# Patient Record
Sex: Female | Born: 1970 | Race: White | Hispanic: No | Smoking: Never smoker
Health system: Southern US, Community
[De-identification: ages and names within clinical notes are randomized; demographics above are authoritative.]

## PROBLEM LIST (undated history)

## (undated) DIAGNOSIS — Z8759 Personal history of other complications of pregnancy, childbirth and the puerperium: Secondary | ICD-10-CM

## (undated) DIAGNOSIS — F411 Generalized anxiety disorder: Secondary | ICD-10-CM

## (undated) DIAGNOSIS — L904 Acrodermatitis chronica atrophicans: Secondary | ICD-10-CM

## (undated) DIAGNOSIS — J309 Allergic rhinitis, unspecified: Secondary | ICD-10-CM

## (undated) DIAGNOSIS — R768 Other specified abnormal immunological findings in serum: Secondary | ICD-10-CM

## (undated) DIAGNOSIS — K589 Irritable bowel syndrome without diarrhea: Secondary | ICD-10-CM

## (undated) DIAGNOSIS — Z8742 Personal history of other diseases of the female genital tract: Secondary | ICD-10-CM

## (undated) DIAGNOSIS — I7381 Erythromelalgia: Secondary | ICD-10-CM

## (undated) DIAGNOSIS — R51 Headache: Secondary | ICD-10-CM

## (undated) DIAGNOSIS — F419 Anxiety disorder, unspecified: Secondary | ICD-10-CM

## (undated) DIAGNOSIS — M5136 Other intervertebral disc degeneration, lumbar region: Secondary | ICD-10-CM

## (undated) DIAGNOSIS — N3010 Interstitial cystitis (chronic) without hematuria: Secondary | ICD-10-CM

## (undated) DIAGNOSIS — I7300 Raynaud's syndrome without gangrene: Secondary | ICD-10-CM

## (undated) HISTORY — DX: Personal history of other diseases of the female genital tract: Z87.42

## (undated) HISTORY — DX: Other specified abnormal immunological findings in serum: R76.8

## (undated) HISTORY — DX: Allergic rhinitis, unspecified: J30.9

## (undated) HISTORY — PX: DILATION AND CURETTAGE OF UTERUS: SHX78

## (undated) HISTORY — DX: Headache: R51

## (undated) HISTORY — PX: LAPAROSCOPY: SHX197

## (undated) HISTORY — PX: CHOLECYSTECTOMY: SHX55

## (undated) HISTORY — DX: Other intervertebral disc degeneration, lumbar region: M51.36

## (undated) HISTORY — DX: Raynaud's syndrome without gangrene: I73.00

## (undated) HISTORY — DX: Anxiety disorder, unspecified: F41.9

## (undated) HISTORY — DX: Interstitial cystitis (chronic) without hematuria: N30.10

## (undated) HISTORY — PX: ABDOMINAL HYSTERECTOMY: SHX81

## (undated) HISTORY — PX: TONSILLECTOMY AND ADENOIDECTOMY: SUR1326

## (undated) HISTORY — PX: APPENDECTOMY: SHX54

## (undated) HISTORY — DX: Erythromelalgia: I73.81

## (undated) HISTORY — DX: Generalized anxiety disorder: F41.1

## (undated) HISTORY — DX: Personal history of other complications of pregnancy, childbirth and the puerperium: Z87.59

## (undated) HISTORY — DX: Acrodermatitis chronica atrophicans: L90.4

## (undated) HISTORY — DX: Irritable bowel syndrome without diarrhea: K58.9

---

## 2002-01-09 ENCOUNTER — Inpatient Hospital Stay (HOSPITAL_COMMUNITY): Admission: AD | Admit: 2002-01-09 | Discharge: 2002-01-09 | Payer: Self-pay | Admitting: Obstetrics and Gynecology

## 2002-01-10 ENCOUNTER — Encounter (INDEPENDENT_AMBULATORY_CARE_PROVIDER_SITE_OTHER): Payer: Self-pay | Admitting: *Deleted

## 2002-01-10 ENCOUNTER — Ambulatory Visit (HOSPITAL_COMMUNITY): Admission: AD | Admit: 2002-01-10 | Discharge: 2002-01-10 | Payer: Self-pay | Admitting: *Deleted

## 2002-05-22 ENCOUNTER — Ambulatory Visit (HOSPITAL_COMMUNITY): Admission: RE | Admit: 2002-05-22 | Discharge: 2002-05-22 | Payer: Self-pay | Admitting: Obstetrics and Gynecology

## 2002-07-23 ENCOUNTER — Ambulatory Visit (HOSPITAL_COMMUNITY): Admission: RE | Admit: 2002-07-23 | Discharge: 2002-07-23 | Payer: Self-pay | Admitting: Obstetrics and Gynecology

## 2002-07-23 ENCOUNTER — Encounter: Payer: Self-pay | Admitting: Obstetrics and Gynecology

## 2002-09-24 ENCOUNTER — Other Ambulatory Visit: Admission: RE | Admit: 2002-09-24 | Discharge: 2002-09-24 | Payer: Self-pay | Admitting: Obstetrics and Gynecology

## 2002-10-22 ENCOUNTER — Other Ambulatory Visit: Admission: RE | Admit: 2002-10-22 | Discharge: 2002-10-22 | Payer: Self-pay | Admitting: Obstetrics and Gynecology

## 2003-01-31 ENCOUNTER — Ambulatory Visit (HOSPITAL_COMMUNITY): Admission: RE | Admit: 2003-01-31 | Discharge: 2003-01-31 | Payer: Self-pay | Admitting: Obstetrics and Gynecology

## 2003-04-12 ENCOUNTER — Inpatient Hospital Stay (HOSPITAL_COMMUNITY): Admission: AD | Admit: 2003-04-12 | Discharge: 2003-04-12 | Payer: Self-pay | Admitting: Obstetrics and Gynecology

## 2003-04-20 ENCOUNTER — Inpatient Hospital Stay (HOSPITAL_COMMUNITY): Admission: AD | Admit: 2003-04-20 | Discharge: 2003-04-25 | Payer: Self-pay | Admitting: Obstetrics and Gynecology

## 2003-04-26 ENCOUNTER — Encounter: Admission: RE | Admit: 2003-04-26 | Discharge: 2003-05-26 | Payer: Self-pay | Admitting: Obstetrics and Gynecology

## 2003-05-30 ENCOUNTER — Other Ambulatory Visit: Admission: RE | Admit: 2003-05-30 | Discharge: 2003-05-30 | Payer: Self-pay | Admitting: Obstetrics and Gynecology

## 2003-10-10 ENCOUNTER — Encounter (INDEPENDENT_AMBULATORY_CARE_PROVIDER_SITE_OTHER): Payer: Self-pay | Admitting: Specialist

## 2003-10-10 ENCOUNTER — Ambulatory Visit (HOSPITAL_COMMUNITY): Admission: RE | Admit: 2003-10-10 | Discharge: 2003-10-10 | Payer: Self-pay | Admitting: Urology

## 2003-10-10 ENCOUNTER — Ambulatory Visit (HOSPITAL_BASED_OUTPATIENT_CLINIC_OR_DEPARTMENT_OTHER): Admission: RE | Admit: 2003-10-10 | Discharge: 2003-10-10 | Payer: Self-pay | Admitting: Urology

## 2004-06-04 ENCOUNTER — Other Ambulatory Visit: Admission: RE | Admit: 2004-06-04 | Discharge: 2004-06-04 | Payer: Self-pay | Admitting: Obstetrics and Gynecology

## 2005-06-22 ENCOUNTER — Other Ambulatory Visit: Admission: RE | Admit: 2005-06-22 | Discharge: 2005-06-22 | Payer: Self-pay | Admitting: Obstetrics and Gynecology

## 2005-10-18 ENCOUNTER — Ambulatory Visit (HOSPITAL_BASED_OUTPATIENT_CLINIC_OR_DEPARTMENT_OTHER): Admission: RE | Admit: 2005-10-18 | Discharge: 2005-10-18 | Payer: Self-pay | Admitting: Urology

## 2006-07-03 ENCOUNTER — Ambulatory Visit (HOSPITAL_COMMUNITY): Admission: RE | Admit: 2006-07-03 | Discharge: 2006-07-03 | Payer: Self-pay | Admitting: Obstetrics and Gynecology

## 2006-08-23 ENCOUNTER — Ambulatory Visit: Payer: Self-pay | Admitting: Internal Medicine

## 2006-10-06 ENCOUNTER — Ambulatory Visit: Payer: Self-pay | Admitting: Internal Medicine

## 2006-10-10 DIAGNOSIS — F411 Generalized anxiety disorder: Secondary | ICD-10-CM | POA: Insufficient documentation

## 2006-10-10 DIAGNOSIS — N3010 Interstitial cystitis (chronic) without hematuria: Secondary | ICD-10-CM

## 2006-10-10 DIAGNOSIS — K589 Irritable bowel syndrome without diarrhea: Secondary | ICD-10-CM | POA: Insufficient documentation

## 2006-10-10 HISTORY — DX: Interstitial cystitis (chronic) without hematuria: N30.10

## 2006-10-10 HISTORY — DX: Generalized anxiety disorder: F41.1

## 2006-10-10 HISTORY — DX: Irritable bowel syndrome, unspecified: K58.9

## 2006-11-08 ENCOUNTER — Telehealth: Payer: Self-pay | Admitting: Internal Medicine

## 2006-11-20 ENCOUNTER — Ambulatory Visit: Payer: Self-pay | Admitting: Internal Medicine

## 2007-03-19 ENCOUNTER — Ambulatory Visit: Payer: Self-pay | Admitting: Internal Medicine

## 2007-03-19 LAB — CONVERTED CEMR LAB
ALT: 15 units/L (ref 0–35)
AST: 20 units/L (ref 0–37)
Albumin: 4.3 g/dL (ref 3.5–5.2)
Alkaline Phosphatase: 63 units/L (ref 39–117)
Amylase: 70 units/L (ref 27–131)
BUN: 10 mg/dL (ref 6–23)
Basophils Absolute: 0 10*3/uL (ref 0.0–0.1)
Basophils Relative: 0.2 % (ref 0.0–1.0)
Bilirubin, Direct: 0.1 mg/dL (ref 0.0–0.3)
CO2: 26 meq/L (ref 19–32)
Calcium: 9.2 mg/dL (ref 8.4–10.5)
Chloride: 103 meq/L (ref 96–112)
Creatinine, Ser: 0.8 mg/dL (ref 0.4–1.2)
Eosinophils Absolute: 0.1 10*3/uL (ref 0.0–0.6)
Eosinophils Relative: 1.7 % (ref 0.0–5.0)
GFR calc Af Amer: 105 mL/min
GFR calc non Af Amer: 87 mL/min
Glucose, Bld: 87 mg/dL (ref 70–99)
HCT: 39.7 % (ref 36.0–46.0)
Hemoglobin: 13.8 g/dL (ref 12.0–15.0)
Lymphocytes Relative: 27.1 % (ref 12.0–46.0)
MCHC: 34.8 g/dL (ref 30.0–36.0)
MCV: 85.9 fL (ref 78.0–100.0)
Magnesium: 2.1 mg/dL (ref 1.5–2.5)
Monocytes Absolute: 0.5 10*3/uL (ref 0.2–0.7)
Monocytes Relative: 10.9 % (ref 3.0–11.0)
Neutro Abs: 2.5 10*3/uL (ref 1.4–7.7)
Neutrophils Relative %: 60.1 % (ref 43.0–77.0)
Platelets: 264 10*3/uL (ref 150–400)
Potassium: 3.9 meq/L (ref 3.5–5.1)
RBC: 4.62 M/uL (ref 3.87–5.11)
RDW: 12.1 % (ref 11.5–14.6)
Sodium: 138 meq/L (ref 135–145)
TSH: 2.05 microintl units/mL (ref 0.35–5.50)
Total Bilirubin: 0.6 mg/dL (ref 0.3–1.2)
Total Protein: 7.1 g/dL (ref 6.0–8.3)
WBC: 4.2 10*3/uL — ABNORMAL LOW (ref 4.5–10.5)

## 2007-04-23 ENCOUNTER — Telehealth (INDEPENDENT_AMBULATORY_CARE_PROVIDER_SITE_OTHER): Payer: Self-pay | Admitting: *Deleted

## 2007-05-30 ENCOUNTER — Ambulatory Visit: Payer: Self-pay | Admitting: Internal Medicine

## 2007-05-31 ENCOUNTER — Telehealth: Payer: Self-pay | Admitting: Internal Medicine

## 2007-06-01 ENCOUNTER — Telehealth (INDEPENDENT_AMBULATORY_CARE_PROVIDER_SITE_OTHER): Payer: Self-pay | Admitting: *Deleted

## 2007-07-13 ENCOUNTER — Ambulatory Visit: Payer: Self-pay | Admitting: Internal Medicine

## 2007-09-14 ENCOUNTER — Ambulatory Visit: Payer: Self-pay | Admitting: Internal Medicine

## 2007-10-30 ENCOUNTER — Ambulatory Visit: Payer: Self-pay | Admitting: Internal Medicine

## 2007-12-25 ENCOUNTER — Telehealth: Payer: Self-pay | Admitting: Internal Medicine

## 2008-01-02 ENCOUNTER — Telehealth: Payer: Self-pay | Admitting: Internal Medicine

## 2008-02-14 ENCOUNTER — Telehealth: Payer: Self-pay | Admitting: Family Medicine

## 2008-04-09 ENCOUNTER — Ambulatory Visit: Payer: Self-pay | Admitting: Internal Medicine

## 2008-04-09 DIAGNOSIS — J309 Allergic rhinitis, unspecified: Secondary | ICD-10-CM | POA: Insufficient documentation

## 2008-04-09 HISTORY — DX: Allergic rhinitis, unspecified: J30.9

## 2008-04-25 ENCOUNTER — Ambulatory Visit (HOSPITAL_COMMUNITY): Admission: RE | Admit: 2008-04-25 | Discharge: 2008-04-25 | Payer: Self-pay | Admitting: Obstetrics and Gynecology

## 2008-05-05 ENCOUNTER — Encounter: Admission: RE | Admit: 2008-05-05 | Discharge: 2008-05-05 | Payer: Self-pay | Admitting: General Surgery

## 2008-05-19 ENCOUNTER — Telehealth: Payer: Self-pay | Admitting: Internal Medicine

## 2008-11-21 ENCOUNTER — Ambulatory Visit: Payer: Self-pay | Admitting: Family Medicine

## 2008-11-24 LAB — CONVERTED CEMR LAB
ALT: 13 units/L (ref 0–35)
AST: 19 units/L (ref 0–37)
BUN: 11 mg/dL (ref 6–23)
Basophils Absolute: 0 10*3/uL (ref 0.0–0.1)
Bilirubin, Direct: 0 mg/dL (ref 0.0–0.3)
Calcium: 9.5 mg/dL (ref 8.4–10.5)
Creatinine, Ser: 0.8 mg/dL (ref 0.4–1.2)
Eosinophils Relative: 1.3 % (ref 0.0–5.0)
GFR calc non Af Amer: 85.79 mL/min (ref >60)
HCT: 40.4 % (ref 36.0–46.0)
Lymphs Abs: 1.9 10*3/uL (ref 0.7–4.0)
Monocytes Relative: 6.7 % (ref 3.0–12.0)
Neutrophils Relative %: 65.6 % (ref 43.0–77.0)
Platelets: 319 10*3/uL (ref 150.0–400.0)
RDW: 12 % (ref 11.5–14.6)
TSH: 1.83 microintl units/mL (ref 0.35–5.50)
Total Bilirubin: 1 mg/dL (ref 0.3–1.2)
WBC: 7.4 10*3/uL (ref 4.5–10.5)

## 2009-03-18 ENCOUNTER — Telehealth: Payer: Self-pay | Admitting: Internal Medicine

## 2009-04-22 ENCOUNTER — Ambulatory Visit: Payer: Self-pay | Admitting: Internal Medicine

## 2009-04-22 DIAGNOSIS — I7381 Erythromelalgia: Secondary | ICD-10-CM

## 2009-04-22 HISTORY — DX: Erythromelalgia: I73.81

## 2009-04-23 LAB — CONVERTED CEMR LAB
Albumin: 4.3 g/dL (ref 3.5–5.2)
Basophils Relative: 0.2 % (ref 0.0–3.0)
CO2: 28 meq/L (ref 19–32)
Calcium: 9.5 mg/dL (ref 8.4–10.5)
Creatinine, Ser: 0.9 mg/dL (ref 0.4–1.2)
Eosinophils Absolute: 0.1 10*3/uL (ref 0.0–0.7)
Eosinophils Relative: 1.8 % (ref 0.0–5.0)
GFR calc non Af Amer: 74.72 mL/min (ref >60)
Glucose, Bld: 78 mg/dL (ref 70–99)
HCT: 38 % (ref 36.0–46.0)
Lymphs Abs: 1.3 10*3/uL (ref 0.7–4.0)
MCHC: 33.9 g/dL (ref 30.0–36.0)
MCV: 88.4 fL (ref 78.0–100.0)
Monocytes Absolute: 0.5 10*3/uL (ref 0.1–1.0)
Platelets: 278 10*3/uL (ref 150.0–400.0)
RBC: 4.3 M/uL (ref 3.87–5.11)
TSH: 1.47 microintl units/mL (ref 0.35–5.50)
Total Protein: 7.1 g/dL (ref 6.0–8.3)
WBC: 5.2 10*3/uL (ref 4.5–10.5)

## 2009-04-24 ENCOUNTER — Telehealth: Payer: Self-pay | Admitting: Internal Medicine

## 2009-04-28 ENCOUNTER — Telehealth: Payer: Self-pay | Admitting: Internal Medicine

## 2009-04-28 LAB — CONVERTED CEMR LAB: Anti Nuclear Antibody(ANA): NEGATIVE

## 2009-06-01 ENCOUNTER — Ambulatory Visit: Payer: Self-pay | Admitting: Internal Medicine

## 2010-01-12 ENCOUNTER — Encounter (INDEPENDENT_AMBULATORY_CARE_PROVIDER_SITE_OTHER): Payer: Self-pay | Admitting: Obstetrics and Gynecology

## 2010-01-12 ENCOUNTER — Ambulatory Visit (HOSPITAL_COMMUNITY): Admission: RE | Admit: 2010-01-12 | Discharge: 2010-01-13 | Payer: Self-pay | Admitting: Obstetrics and Gynecology

## 2010-04-27 NOTE — Assessment & Plan Note (Signed)
Summary: 4 week FU/et/pt rescd//ccm/PT RSC/CJR//PT RESCD//CCM   Vital Signs:  Patient profile:   40 year old female Weight:      138 pounds Temp:     98.5 degrees F Pulse rate:   72 / minute Pulse rhythm:   regular Resp:     12 per minute BP sitting:   108 / 72  (left arm) Cuff size:   regular  Vitals Entered By: Gladis Riffle, RN (June 01, 2009 8:54 AM) CC: 4 week ro; c/o tingling left foot and leg since increased aspirin--requests substitute for nasonex Is Patient Diabetic? No   CC:  4 week ro; c/o tingling left foot and leg since increased aspirin--requests substitute for nasonex.  History of Present Illness: erythromelalgia---much improved on ASA two times a day still with some discoloration and tingling no pain able to exercise but has some worsening of sxs after exercise  Preventive Screening-Counseling & Management  Alcohol-Tobacco     Smoking Status: never  Current Medications (verified): 1)  Zyrtec Allergy 10 Mg  Tabs (Cetirizine Hcl) .... Once Daily As Needed 2)  Xanax 0.25 Mg  Tabs (Alprazolam) .... 1/2-1 As Needed 3)  Multivitamins   Caps (Multiple Vitamin) .... One By Mouth Daily 4)  Advil 200 Mg  Caps (Ibuprofen) .... Prn 5)  Nasonex 50 Mcg/act Susp (Mometasone Furoate) .... 2 Spray Each Nares  Q D As Needed 6)  Pyridium 200 Mg Tabs (Phenazopyridine Hcl) .... One By Mouth Three Times A Day As Needed 7)  Aspirin 325 Mg Tabs (Aspirin) .... Take 1 Tablet By Mouth Two Times A Day  Allergies: 1)  ! Penicillin  Physical Exam  General:  alert and well-developed.   Head:  normocephalic and atraumatic.   Eyes:  pupils equal and pupils round.   Neck:  No deformities, masses, or tenderness noted. Lungs:  Normal respiratory effort, chest expands symmetrically. Lungs are clear to auscultation, no crackles or wheezes. Abdomen:  normal bowel sounds. Soft nontender without mass Msk:  No deformity or scoliosis noted of thoracic or lumbar spine.   Pulses:  R radial  normal, R dorsalis pedis normal, L radial normal, and L dorsalis pedis normal.   Neurologic:  cranial nerves II-XII intact and gait normal.     Impression & Recommendations:  Problem # 1:  ERYTHROMELALGIA (BMW-413.24) unclear whether any further eval necessary discuss with dr. Titus Dubin.  interesting story along with interstitial cystitis, etc... may need further immunology evaluation  Complete Medication List: 1)  Zyrtec Allergy 10 Mg Tabs (Cetirizine hcl) .... Once daily as needed 2)  Xanax 0.25 Mg Tabs (Alprazolam) .... 1/2-1 as needed 3)  Multivitamins Caps (Multiple vitamin) .... One by mouth daily 4)  Advil 200 Mg Caps (Ibuprofen) .... Prn 5)  Fluticasone Propionate 50 Mcg/act Susp (Fluticasone propionate) .... 2 sprays each nostril once daily 6)  Pyridium 200 Mg Tabs (Phenazopyridine hcl) .... One by mouth three times a day as needed 7)  Aspirin 325 Mg Tabs (Aspirin) .... Take 1 tablet by mouth two times a day Prescriptions: FLUTICASONE PROPIONATE 50 MCG/ACT  SUSP (FLUTICASONE PROPIONATE) 2 sprays each nostril once daily  #1 vial x 3   Entered and Authorized by:   Birdie Sons MD   Signed by:   Birdie Sons MD on 06/01/2009   Method used:   Electronically to        [facility] [address] [phone]* (retail)       [address]  [place], [place]  [address]       Ph: [phone] or [phone]       Fax: [phone]   RxID:   [id number]

## 2010-04-27 NOTE — Assessment & Plan Note (Signed)
Summary: fup on meds-foot issues//ccm   Vital Signs:  Patient profile:   40 year old female Weight:      138 pounds Temp:     98.3 degrees F Pulse rate:   72 / minute Resp:     12 per minute BP sitting:   100 / 76  (left arm)  Vitals Entered By: Gladis Riffle, RN (April 22, 2009 9:19 AM)   History of Present Illness: burning feet at night---have to run water on them at night. occurs nightly ongoing for months progressively worse  hands will turn bright red no other associated sxs she has no significant daytime sxs no sxs of raynauds  All other systems reviewed and were negative   Preventive Screening-Counseling & Management  Alcohol-Tobacco     Smoking Status: never  Current Problems (verified): 1)  Allergic Rhinitis Cause Unspecified  (ICD-477.9) 2)  Interstitial Cystitis  (ICD-595.1) 3)  Anxiety Disorder, Generalized  (ICD-300.02) 4)  Irritable Bowel Syndrome  (ICD-564.1)  Current Medications (verified): 1)  Zyrtec Allergy 10 Mg  Tabs (Cetirizine Hcl) .... Once Daily As Needed 2)  Xanax 0.25 Mg  Tabs (Alprazolam) .... 1/2-1 As Needed 3)  Multivitamins   Caps (Multiple Vitamin) .... One By Mouth Daily 4)  Advil 200 Mg  Caps (Ibuprofen) .... Prn 5)  Nasonex 50 Mcg/act Susp (Mometasone Furoate) .... 2 Spray Each Nares  Q D As Needed 6)  Pyridium 200 Mg Tabs (Phenazopyridine Hcl) .... One By Mouth Three Times A Day As Needed  Allergies: 1)  ! Penicillin  Comments:  Nurse/Medical Assistant: FU meds and c/o redness and swelling several toes both feet--began after ran half-marathon but kept spreading about four weeks ago--using steroid cream with some relief  The patient's medications and allergies were reviewed with the patient and were updated in the Medication and Allergy Lists. Gladis Riffle, RN (April 22, 2009 9:24 AM)  Past History:  Past Medical History: Last updated: 04/09/2008 anxiety attacks interstitial cystitis IBS endometriosis positive ANA    Past Surgical History: Last updated: 04/09/2008 surgery for endometriosis 04/08 Appendectomy  1987 Cholecystectomy  1998 Tonsillectomy  1977 miscarriages X2    Family History: Last updated: 04/09/2008 brother has lupus   Social History: Last updated: 04/09/2008 Married Never Smoked Regular exercise-yes Ets no  HH of 3   1 pet cats.      Risk Factors: Exercise: yes (03/19/2007)  Risk Factors: Smoking Status: never (04/22/2009)  Review of Systems       All other systems reviewed and were negative   Physical Exam  General:  Well-developed,well-nourished,in no acute distress; alert,appropriate and cooperative throughout examination Head:  normocephalic and atraumatic.   Eyes:  pupils equal and pupils round.   Ears:  R ear normal and L ear normal.   Neck:  No deformities, masses, or tenderness noted. Lungs:  Normal respiratory effort, chest expands symmetrically. Lungs are clear to auscultation, no crackles or wheezes. Heart:  Normal rate and regular rhythm. S1 and S2 normal without gallop, murmur, click, rub or other extra sounds. Abdomen:  normal bowel sounds. Soft nontender without mass Pulses:  R radial normal, R femoral normal, R popliteal normal, L radial normal, L femoral normal, and L popliteal normal.   Neurologic:  cranial nerves II-XII intact and gait normal.     Impression & Recommendations:  Problem # 1:  ERYTHROMELALGIA (ICD-443.82)  by hx asa two times a day labs today suspect idiopathic she is concerned with CTD---as she has family members with  lupus  Orders: Venipuncture (10272) T-Antinuclear Antib (ANA) (53664-40347) TLB-CBC Platelet - w/Differential (85025-CBCD) TLB-Sedimentation Rate (ESR) (85652-ESR) TLB-BMP (Basic Metabolic Panel-BMET) (80048-METABOL) TLB-Hepatic/Liver Function Pnl (80076-HEPATIC) TLB-TSH (Thyroid Stimulating Hormone) (84443-TSH)  Complete Medication List: 1)  Zyrtec Allergy 10 Mg Tabs (Cetirizine hcl) .... Once  daily as needed 2)  Xanax 0.25 Mg Tabs (Alprazolam) .... 1/2-1 as needed 3)  Multivitamins Caps (Multiple vitamin) .... One by mouth daily 4)  Advil 200 Mg Caps (Ibuprofen) .... Prn 5)  Nasonex 50 Mcg/act Susp (Mometasone furoate) .... 2 spray each nares  q d as needed 6)  Pyridium 200 Mg Tabs (Phenazopyridine hcl) .... One by mouth three times a day as needed 7)  Aspirin 325 Mg Tabs (Aspirin) .... Take 1 tablet by mouth two times a day Prescriptions: XANAX 0.25 MG  TABS (ALPRAZOLAM) 1/2-1 as needed  #30 x 0   Entered and Authorized by:   Birdie Sons MD   Signed by:   Birdie Sons MD on 04/22/2009   Method used:   Print then Give to Patient   RxID:   [id number]

## 2010-04-27 NOTE — Progress Notes (Signed)
Summary: reporting in and wants advice  Phone Note Call from Patient   Caller: Patient--live call Call For: Birdie Sons MD Summary of Call: States swelling of toes better though they remain discolored.  She was able to run a couple days.  She does want to know if she continues on ASA two times a day and if so, for how long.    ( Notified of ANA result.) Initial call taken by: Gladis Riffle, RN,  April 28, 2009 12:11 PM  Follow-up for Phone Call        stay on ASA---i'd like to see her in 4 weeks Follow-up by: Birdie Sons MD,  April 28, 2009 12:34 PM  Additional Follow-up for Phone Call Additional follow up Details #1::        Patient notified. appt made for 4 weeks. Additional Follow-up by: Gladis Riffle, RN,  April 28, 2009 1:26 PM

## 2010-04-27 NOTE — Progress Notes (Signed)
Summary: FYI  Phone Note Call from Patient Call back at [phone]   Caller: Patient Call For: Birdie Sons MD Summary of Call: wants to report she is having weird tingling of hands and feet since taking ASA.  She thinks her feet are looking somewhat better, but was still awakened in night to run cold water over hands and feet.  She wants to remind you she has positive ANA and is worried about Lupus.  She will call back on Tues as scheduled and no need to call back before then. Initial call taken by: Gladis Riffle, RN,  April 24, 2009 5:01 PM

## 2010-04-30 ENCOUNTER — Telehealth: Payer: Self-pay | Admitting: Internal Medicine

## 2010-04-30 NOTE — Telephone Encounter (Signed)
Ok to switch   With me.

## 2010-04-30 NOTE — Telephone Encounter (Signed)
ok 

## 2010-04-30 NOTE — Telephone Encounter (Signed)
Pt would like to switch from Dr Cato Mulligan to Dr Fabian Sharp.... Ok to switch?    Pt can be reached at [phone].

## 2010-05-06 ENCOUNTER — Ambulatory Visit (INDEPENDENT_AMBULATORY_CARE_PROVIDER_SITE_OTHER): Payer: Managed Care, Other (non HMO) | Admitting: Internal Medicine

## 2010-05-06 ENCOUNTER — Encounter: Payer: Self-pay | Admitting: Internal Medicine

## 2010-05-06 VITALS — BP 114/88 | HR 76 | Temp 98.0°F | Ht 63.0 in | Wt 141.0 lb

## 2010-05-06 DIAGNOSIS — J329 Chronic sinusitis, unspecified: Secondary | ICD-10-CM

## 2010-05-06 MED ORDER — DOXYCYCLINE HYCLATE 100 MG PO TABS: 100.0000 mg | ORAL_TABLET | Freq: Two times a day (BID) | ORAL | Status: AC

## 2010-05-06 NOTE — Progress Notes (Signed)
  Subjective:    Patient ID: Erica Saunders, female    DOB: 1971/01/16, 40 y.o.   MRN: [id number]  Sinusitis This is a new problem. The current episode started 1 to 4 weeks ago. The problem has been gradually worsening since onset. There has been no fever. Her pain is at a severity of 6/10. Associated symptoms include congestion, sinus pressure and swollen glands. Pertinent negatives include no chills, coughing, diaphoresis, ear pain, neck pain or shortness of breath. Past treatments include nothing.      Review of Systems  Constitutional: Negative for chills, diaphoresis, activity change and appetite change.  HENT: Positive for congestion and sinus pressure. Negative for ear pain, nosebleeds, neck pain and neck stiffness.   Eyes: Negative for pain and discharge.  Respiratory: Negative for cough, chest tightness and shortness of breath.   Cardiovascular: Negative for chest pain.  Gastrointestinal: Negative for abdominal pain.  Genitourinary: Negative for dysuria and flank pain.  Musculoskeletal: Negative for back pain and gait problem.  Neurological: Negative for dizziness.  Hematological: Negative for adenopathy.  Psychiatric/Behavioral: Negative for confusion.       Objective:   Physical Exam  Constitutional: She appears well-developed and well-nourished. No distress.  HENT:  Head: Normocephalic.  Right Ear: External ear normal.  Left Ear: External ear normal.  Eyes: EOM are normal. Right eye exhibits no discharge. Left eye exhibits no discharge.  Neck: No thyromegaly present.  Cardiovascular: Normal rate.   Pulmonary/Chest: No respiratory distress. She has no wheezes. She exhibits no tenderness.  Abdominal: Soft. Bowel sounds are normal. She exhibits no distension. There is no tenderness.  Musculoskeletal: She exhibits no edema.  Lymphadenopathy:    She has no cervical adenopathy.  Neurological: No cranial nerve deficit.  Psychiatric: She has a normal mood and affect. Her  behavior is normal.          Assessment & Plan:   presumed sinusitis. Given the duration will treat with an antibiotic. Side effects of antibiotics discussed. She will call if symptms persist.

## 2010-06-04 ENCOUNTER — Other Ambulatory Visit: Payer: Self-pay | Admitting: Obstetrics and Gynecology

## 2010-06-04 DIAGNOSIS — N6459 Other signs and symptoms in breast: Secondary | ICD-10-CM

## 2010-06-09 LAB — CBC
HCT: 30.4 % — ABNORMAL LOW (ref 36.0–46.0)
HCT: 41.6 % (ref 36.0–46.0)
Hemoglobin: 10.5 g/dL — ABNORMAL LOW (ref 12.0–15.0)
MCH: 30.7 pg (ref 26.0–34.0)
MCH: 29.7 pg (ref 26.0–34.0)
MCHC: 34.5 g/dL (ref 30.0–36.0)
MCHC: 33.3 g/dL (ref 30.0–36.0)
MCV: 89 fL (ref 78.0–100.0)
RDW: 13.1 % (ref 11.5–15.5)

## 2010-06-09 LAB — MRSA CULTURE

## 2010-06-09 LAB — BASIC METABOLIC PANEL
BUN: 11 mg/dL (ref 6–23)
CO2: 29 mEq/L (ref 19–32)
Chloride: 102 mEq/L (ref 96–112)
GFR calc non Af Amer: >60 mL/min (ref >60)
Glucose, Bld: 63 mg/dL — ABNORMAL LOW (ref 70–99)
Potassium: 3.9 mEq/L (ref 3.5–5.1)

## 2010-06-10 ENCOUNTER — Other Ambulatory Visit: Payer: Managed Care, Other (non HMO)

## 2010-06-11 ENCOUNTER — Ambulatory Visit
Admission: RE | Admit: 2010-06-11 | Discharge: 2010-06-11 | Disposition: A | Discharge status: Home / Self Care | Payer: Managed Care, Other (non HMO) | Source: Ambulatory Visit | Attending: Obstetrics and Gynecology | Admitting: Obstetrics and Gynecology

## 2010-06-11 DIAGNOSIS — N6459 Other signs and symptoms in breast: Secondary | ICD-10-CM

## 2010-08-13 NOTE — Op Note (Signed)
NAME:  Erica Saunders, GUIDO                       ACCOUNT NO.:  [id number]   MEDICAL RECORD NO.:  [id number]                   PATIENT TYPE:  AMB   LOCATION:  [facility]                                 FACILITY:  [facility]   PHYSICIAN:  Jamison Neighbor, M.D.               DATE OF BIRTH:  1970-10-19   DATE OF PROCEDURE:  10/10/2003  DATE OF DISCHARGE:                                 OPERATIVE REPORT   PREOPERATIVE DIAGNOSES:  Chronic pelvic pain/interstitial cystitis.   POSTOPERATIVE DIAGNOSES:  Chronic pelvic pain/interstitial cystitis.   PROCEDURE:  1. Cystoscopy.  2. Urethral calibration.  3. Hydrodistention of the bladder.  4. Marcaine and Pyridium instillation.  5. Marcaine and Kenalog injection.  6. Bladder biopsy.   SURGEON:  Jamison Neighbor, M.D.   ANESTHESIA:  General.   COMPLICATIONS:  None.   DRAINS:  None.   BRIEF HISTORY:  This 40 year old female has had longstanding problems with  urgency and frequency as well as dyspareunia.  The patient had resolution of  her symptoms during her pregnancy, and then they returned after the birth of  her child.  The patient's symptoms are much worse prior to her periods.  We  also note that she has irritable bowel syndrome and clearly all of the signs  point to the possibility that she has interstitial cystitis.  The patient  has had no evidence of urinary tract infection.  She has not responded to  anticholinergic therapy.  In fact, she feels those make her worse.  She  notes that anticholinergics tend to make her have a difficult time  urinating.  The patient was advised that she could have a potassium test in  the office as a method of testing for interstitial cystitis, but she refused  and asked that a cystoscopy and hydrodistention be performed.  She  understands that this may certainly make her feel worse temporarily, and  there is no guarantee she will have any benefit from a therapeutic  standpoint from this.  She gave full  informed consent for the procedure.   DESCRIPTION OF PROCEDURE:  After the successful induction of general  anesthesia, the patient was placed in the dorsal lithotomy position, prepped  with Betadine, and draped in the usual sterile fashion.  Careful bimanual  examination showed no abnormalities of the urethra.  The uterus was palpably  normal.  There was no prolapse noted.  She had no significant cystocele,  rectocele, or enterocele.  There were no masses on bimanual exam.  There was  no urethritis or vaginitis.  The urethra was calibrated to 32 [place] and was  normal in size with no evidence of stenosis or stricture.  The cystoscope  was inserted, and the bladder was carefully inspected.  It was free of any  tumor or stones.  Both ureteral orifices were normal in configuration and  location.  Hydrodistention of the bladder was then performed.  The bladder  was distended at a pressure of 100 cm of water for 5 minutes.  When the  bladder was drained, the patient was found to have a nearly normal bladder  capacity at 1000 mL but had significant glomerulations throughout the  bladder.  No ulcers could be seen.  The patient underwent a bladder biopsy.  This will be sent for mast cell analysis.  The biopsy site was cauterized  with a Bugbee electrode.  A mixture of Marcaine and Pyridium was left within  the bladder.  Marcaine and Kenalog were injected periurethrally.  The  patient tolerated the procedure well and was taken to the recovery room in  good condition.  She received intraoperative Toradol and Zofran as well as  B&O suppository.  She will be sent home with Lorcet Plus, Pyridium Plus, and  3 days of Tequin.  She will return to the office in 3 weeks time.  At that  point, we will determine if she has had any improvement from her symptoms.  If the patient has not had major improvement, she will be started on  instillation therapy.  If she does feel better, she will be started on oral   therapy with Elmiron, Atarax, and possibly a low dose tricyclic  antidepressant.                                               Jamison Neighbor, M.D.    RJE/MEDQ  D:  10/10/2003  T:  10/10/2003  Job:  [id number]   cc:   Marcelino Duster L. Vincente Poli, M.D.  [address]  [place] [address]  Fax: [phone]

## 2010-08-13 NOTE — H&P (Signed)
Erica Saunders, KAWA NO.:  [id number]   MEDICAL RECORD NO.:  [id number]          PATIENT TYPE:  AMB   LOCATION:  [facility]                           FACILITY:  [facility]   PHYSICIAN:  Lenoard Aden, M.D.DATE OF BIRTH:  09/25/1970   DATE OF ADMISSION:  07/02/2006  DATE OF DISCHARGE:                              HISTORY & PHYSICAL   CHIEF COMPLAINT:  Pelvic pain.  She is a 40 year old Caucasian female,  G3 P1 with a history of persistent pain, dysmenorrhea and desire to  conceive who presents for laparoscopic evaluation.   She has ALLERGY TO PENICILLIN.  She has a history of appendectomy in  1986, cholecystectomy in 1988 and a D and C in 2003.  History of  tonsillectomy, history of treatment for interstitial cystitis.   MEDICATIONS:  Advil p.r.n., Zyrtec.   She has a history of one uncomplicated vaginal delivery.   PHYSICAL EXAM:  GENERAL:  She is a well-developed, well-nourished white  female in no acute distress.  HEENT:  Normal.  LUNGS;  Clear.  HEART:  Regular rhythm.  ABDOMEN:  Soft, nontender.  PELVIC:  Normal size uterus and no adnexal masses.  EXTREMITIES:  Normal.  NEUROLOGIC:  Exam is nonfocal.   IMPRESSION:  Severe dysmenorrhea, refractory therapy.   PLAN:  Proceed with diagnostic  laparoscopy, possible ablation of  endometriosis.  Risks from anesthesia, infection, bleeding, and  intraabdominal ________ discussed, ________ complications to include  ________.  The patient acknowledges and wishes to proceed.      Lenoard Aden, M.D.  Electronically Signed     RJT/MEDQ  D:  07/02/2006  T:  07/03/2006  Job:  [id number]   cc:   Lenoard Aden, M.D.  Fax: [phone]

## 2010-08-13 NOTE — H&P (Signed)
   NAME:  Erica Saunders, Erica Saunders                       ACCOUNT NO.:  [id number]   MEDICAL RECORD NO.:  [id number]                   PATIENT TYPE:  AMB   LOCATION:  [facility]                                 FACILITY:  [facility]   PHYSICIAN:  Tracie Harrier, M.D.              DATE OF BIRTH:  18-Apr-1970   DATE OF ADMISSION:  01/10/2002  DATE OF DISCHARGE:                                HISTORY & PHYSICAL   HISTORY OF PRESENT ILLNESS:  The patient is a 40 year old female gravida 1  at nine weeks gestation by estimated last menstrual period.  She underwent  an ultrasound yesterday which showed intrauterine fetal demise at about six  weeks.  She declined D&C at that time.  The patient is admitted now with  heavy vaginal bleeding and extreme uterine cramping.  She is now admitted  for D&C.  Again, the option of expected management versus D&C given and she  strongly requested D&C.   MEDICAL HISTORY:  None.   SURGICAL HISTORY:  1. Tonsillectomy.  2. Appendectomy.  3. Cholecystectomy.   CURRENT MEDICATIONS:  Prenatal vitamins.   ALLERGIES:  PENICILLIN and PROZAC.   PHYSICAL EXAMINATION:  VITAL SIGNS:  Stable, afebrile.  GENERAL:  She is a well-developed, well-nourished female in a moderate  amount of pain.  HEENT:  Within normal limits.  NECK:  Supple without adenopathy or thyromegaly.  HEART:  Regular rate and rhythm without murmur, gallop, or rub.  LUNGS:  Clear.  BREAST:  Exam deferred.  ABDOMEN:  Soft and benign.  EXTREMITIES AND NEUROLOGIC:  Grossly normal.  PELVIC:  Deferred at time of admission.  This will be performed under  anesthesia when the patient is more comfortable.   LABORATORY DATA:  Blood type is Rh negative.  The patient did receive RhoGAM  on January 09, 2002.   ADMITTING DIAGNOSES:  1. Incomplete abortion.  2. Negative blood type (status post RhoGAM on January 09, 2002).   PLAN:  D&E.    DISCUSSION:  The risks and benefits of this procedure was discussed with the  patient.  The option of expectant management versus D&E given.  She strongly  requested D&E.  Questions were answered regarding the surgery.                                                 Tracie Harrier, M.D.    REG/MEDQ  D:  01/10/2002  T:  01/11/2002  Job:  [id number]

## 2010-08-13 NOTE — Discharge Summary (Signed)
NAME:  Erica Saunders, Erica Saunders                       ACCOUNT NO.:  [id number]   MEDICAL RECORD NO.:  [id number]                   PATIENT TYPE:  INP   LOCATION:  9124                                 FACILITY:  [facility]   PHYSICIAN:  Freddy Finner, M.D.                DATE OF BIRTH:  Dec 17, 1970   DATE OF ADMISSION:  04/20/2003  DATE OF DISCHARGE:  04/25/2003                                 DISCHARGE SUMMARY   ADMITTING DIAGNOSES:  1. Intrauterine pregnancy at 57 1/2 weeks' estimated gestational age.  2. Induction of labor due to post dates.   DISCHARGE DIAGNOSES:  1. Status post low transverse cesarean section secondary to failure to     progress.  2. Viable female infant.   PROCEDURE:  Primary low transverse cesarean section.   REASON FOR ADMISSION:  Please see written H&P.   HOSPITAL COURSE:  The patient was a 40 year old prima gravida that was  admitted to __________ [facility] for two-stage induction due to post  dates.  On admission, the cervix was closed, 50% effaced, with the vertex at  a -3 station.  Contractions were noted to be irregular.  Fetal heart tones  were reactive in the 140s.  Cytotec was administered that evening for  ripening of the cervix.  On the following morning, the patient was very  uncomfortable.  Vital signs were stable.  She was afebrile.  Fetal heart  tones continued to be reactive.  Contractions were noted to be every two  minutes.  The cervix was now dilated to 1 cm, 90% effaced, at a -2 station.  Artifical rupture of membranes was performed which revealed clear fluid.  Epidural was placed for patient's comfort.  Later that evening, the patient  had been in labor all day.  The epidural was in place.  The cervix was now  dilated to 4 cm, 90% effaced, at a -2 station.  Vertex was noted to be in  the transverse position.  The decision was made to proceed with a low  transverse cesarean section due to failure to progress.  The patient was  then transferred  to the operating room where epidural was dosed to an  adequate surgical level.  A low transverse incision was made with the  delivery of a viable female infant weighing 7 pounds 9 ounces with Apgars of 8  at one minute and 9 at five minutes.  The patient tolerated the procedure  well and was taken to the recovery room in stable condition.  On  postoperative day one, the patient was without complaints.  Vital signs were  stable.  She was afebrile.  Abdomen was soft with good return of bowel  function.  The fundus was firm and nontender.  Abdominal dressing was noted  to be clean, dry, and intact.  On postoperative day two, vital signs were  stable.  The patient was afebrile.  Abdomen was soft.  Fundus was firm.  Abdominal dressing had been removed revealing an incision that was clean,  dry, and intact.  On postoperative day three, the patient was somewhat  teary.  The baby had lost weight and seemed to be somewhat fussy.  Vital  signs were stable.  She was afebrile.  Abdomen was slightly distended.  Fundus was firm and nontender.  Some slight erythema was noted superior to  the incision site at the midpoint.  Otherwise, the incision was clean, dry,  and intact.  She was ambulating well and tolerating a regular diet without  complaints of nausea or vomiting.  On postoperative day four, the patient  was without complaints.  Vital signs were stable.  She was afebrile.  Fundus  was firm and nontender.  Abdomen was soft.  Incision was clean, dry, and  intact.  Staples were removed.  Some slight ecchymosis was noted inferior to  the incisional site.  Instructions are __________ and the patient was  discharged home.   CONDITION ON DISCHARGE:  Good.   DIET:  Regular as tolerated.   ACTIVITY:  No heavy lifting.  No driving times two weeks.  No vaginal entry.   FOLLOWUP:  The patient is to follow up in the office in one to two weeks for  an incision check.  She is to call for a temperature greater  than 100  degrees, persistent nausea and vomiting, heavy vaginal bleeding, and/or  redness or drainage from the incisional site.   DISCHARGE MEDICATIONS:  1. Percocet 5/325, #30, one p.o. q.4-6h. as needed for pain.  2. Motrin 600 mg q.6h. as needed.  3. Prenatal vitamins one p.o. daily.  4. Colace one p.o. daily as needed.     Julio Sicks, N.P.                        Freddy Finner, M.D.    CC/MEDQ  D:  05/26/2003  T:  05/26/2003  Job:  [id number]

## 2010-08-13 NOTE — Op Note (Signed)
   NAMENAKEA, GOUGER                       ACCOUNT NO.:  [id number]   MEDICAL RECORD NO.:  [id number]                   PATIENT TYPE:  AMB   LOCATION:  [facility]                                 FACILITY:  [facility]   PHYSICIAN:  Tracie Harrier, M.D.              DATE OF BIRTH:  [birth year]   DATE OF PROCEDURE:  01/10/2002  DATE OF DISCHARGE:                                 OPERATIVE REPORT   PREOPERATIVE DIAGNOSES:  Incomplete abortion.   POSTOPERATIVE DIAGNOSES:  Incomplete abortion.   PROCEDURE:  Dilatation and evacuation.   SURGEON:  Tracie Harrier, M.D.   ANESTHESIA:  MAC.   ESTIMATED BLOOD LOSS:  Less than 20 cc.   COMPLICATIONS:  None.   FINDINGS:  Products of conception.   PROCEDURE:  The patient was taken to the operating room where a MAC  anesthetic was administered.  The patient was placed on the operating table  in the dorsal lithotomy position.  The perineum and vagina were prepped and  draped in the usual sterile fashion with Betadine and sterile drapes.  A  speculum was placed inside the vagina and the anterior lip of the cervix was  grasped with a single tooth tenaculum.  The cervix was noted to be quite  dilated.  Next, using a number 7 suction curette the uterus was thoroughly  emptied in the standard fashion.  Approximately three passes were made  throughout the intrauterine cavity to empty it.  All vaginal instruments  were then removed.  The patient tolerated this fairly well, but did move a  great deal during the procedure.   The patient does have a negative blood type, but did receive RhoGAM on  January 09, 2002.                                               Tracie Harrier, M.D.    REG/MEDQ  D:  01/10/2002  T:  01/11/2002  Job:  [id number]

## 2010-08-13 NOTE — Op Note (Signed)
NAMESILVANA, HOLECEK NO.:  [id number]   MEDICAL RECORD NO.:  [id number]          PATIENT TYPE:  AMB   LOCATION:  [facility]                         FACILITY:  [facility]   PHYSICIAN:  Jamison Neighbor, M.D.  DATE OF BIRTH:  Jul 17, 1970   DATE OF PROCEDURE:  DATE OF DISCHARGE:                                 OPERATIVE REPORT   PREOPERATIVE DIAGNOSIS:  Interstitial cystitis, painful bladder syndrome.   POSTOPERATIVE DIAGNOSIS:  Interstitial cystitis, painful bladder syndrome.   PROCEDURE:  Cystoscopy, urethral calibration, hydrodistention of the  bladder, Marcaine and Pyridium instillation, Marcaine and Kenalog injection.   SURGEON:  Jamison Neighbor, M.D.   ANESTHESIA:  General.   COMPLICATIONS:  None.   DRAINS:  None.   BRIEF HISTORY:  This 40 year old female has had evidence of painful bladder  syndrome.  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  The patient is interested in repeat hydrodistention because she had  such good long-term relief from the first hydrodistention.  She is  interested in getting pregnant and is aware of the fact that many of the  interstitial cystitis medications cannot be taken during pregnancy,  particularly Elmiron.  For that reason, she hopes to have some improvement  until she is pregnant, and then she realizes there is an approximately 80%  chance that she will go into remission during pregnancy.  The patient  understands the risks and benefits of the procedure and especially notes  there is no guarantee that she will have any improvement in her symptoms.  She gave full informed consent.   PROCEDURE:  After the successful induction of general anesthesia, the  patient is placed in a dorsal lithotomy position, prepped with Betadine, and  draped in the usual sterile  fashion.  Careful bimanual examination revealed  an unremarkable urethra with no signs of diverticulum.  There was no  cystocele, rectocele, or enterocele.  There was no mass on bimanual exam.  The urethra was calibrated to a 54 [place] with female urethral sounds with  no evidence of stenosis or stricture.  The cystoscope was inserted.  The  bladder was carefully inspected.  It was free of any tumor or stones.  Both  ureteral orifices were normal in configuration and location.  The bladder  was distended at a pressure of 100 cm of water for five minutes.  When the  bladder was drained, glomerulations could be seen throughout the bladder,  but the bladder capacity was quite normal at over 1000 cc, approaching 1100  cc.  The number of glomerulations was relatively modest and had we not had  her history of pain and positive biopsy for mast cells, we would consider  this to be a reasonably normal hydrodistention.  There were no ulcers and  nothing to require biopsy  or cauterization.  The bladder was drained.  A mixture of Marcaine and  Pyridium was left in the bladder.  A mixture of Marcaine and  Kenalog was  injected periurethrally.  The patient tolerated the procedure well and was  taken to the recovery room in good condition.           ______________________________  Jamison Neighbor, M.D.  Electronically Signed     RJE/MEDQ  D:  10/18/2005  T:  10/18/2005  Job:  [id number]

## 2010-08-13 NOTE — Op Note (Signed)
NAME:  Erica Saunders, Erica Saunders                       ACCOUNT NO.:  [id number]   MEDICAL RECORD NO.:  [id number]                   PATIENT TYPE:  INP   LOCATION:  9124                                 FACILITY:  [facility]   PHYSICIAN:  Michelle L. Vincente Poli, M.D.            DATE OF BIRTH:  1971-01-30   DATE OF PROCEDURE:  04/21/2003  DATE OF DISCHARGE:                                 OPERATIVE REPORT   PREOPERATIVE DIAGNOSES:  1. Intrauterine pregnancy at  40 weeks 3 days.  2. Failure to progress.   POSTOPERATIVE DIAGNOSES:  1. Intrauterine pregnancy at  40 weeks 3 days.  2. Failure to progress.   PROCEDURES:  Primary low transverse cesarean section.   SURGEON:  Michelle L. Vincente Poli, M.D.   ANESTHESIA:  Epidural.   ESTIMATED BLOOD LOSS:  500 mL.   FINDINGS:  A female infant in cephalic presentation, Apgars 8 at one minute, 9  at five minutes, with a weight of 7 pounds 9 ounces.   DESCRIPTION OF PROCEDURE:  The patient was taken to the operating room.  She  was given her epidural in labor and delivery but it was dosed in the  operating room and found to be adequate.  A Foley catheter was already  placed in the bladder.  A low transverse incision was made in the abdomen  after a sterile drape was applied, carried down to the fascia.  The fascia  was scored in the midline, extended laterally.  The fascial incision was  then developed and the rectus muscles were separated from the peritoneum.  The peritoneum was then stretched.  The peritoneal incision was then  stretched further and the bladder blade was inserted and the lower uterine  segment was identified.  A bladder flap was created sharply and then  digitally and the bladder blade was then readjusted.  A low transverse  incision was made in the uterus.  The uterus was entered using a hemostat.  The baby was in cephalic presentation, was in transverse position, delivered  easily with two pulls of the vacuum and one pop-off.  The baby was a  female  infant, and Apgars were 8 at one minute and 9 at five minutes, weight 7  pounds 9 ounces.  The cord was clamped and cut.  The baby was handed to the  waiting pediatrician.  The cord blood was obtained.  The placenta was  manually removed and noted to be normal and intact and was removed.  The  uterus was exteriorized and cleared of all clots and debris.  The uterine  incision was closed in a single layer using 0 chromic in a continuous  running locked stitch.  It was returned to the abdomen and irrigation was  performed.  The incision was reinspected and noted to be hemostatic.  The  peritoneum was closed with an 0 Vicryl in continuous running stitch and the  rectus muscles were reapproximated using the  same 0 Vicryl.  The fascia was  closed using 0 Vicryl in continuous running stitch  starting at each corner and meeting in the midline.  After irrigation of the  subcutaneous layer, the skin was closed with staples.  All sponge, lap, and  instrument counts were correct x2.  The patient tolerated the procedure well  and went to the recovery room in stable condition.                                               Michelle L. Vincente Poli, M.D.    Florestine Avers  D:  04/21/2003  T:  04/22/2003  Job:  [id number]

## 2010-08-13 NOTE — Op Note (Signed)
NAMESHULAMIT, DONOFRIO NO.:  [id number]   MEDICAL RECORD NO.:  [id number]          PATIENT TYPE:  AMB   LOCATION:  [facility]                           FACILITY:  [facility]   PHYSICIAN:  Lenoard Aden, M.D.DATE OF BIRTH:  [birth year]   DATE OF PROCEDURE:  07/03/2006  DATE OF DISCHARGE:                               OPERATIVE REPORT   PREOPERATIVE DIAGNOSES:  1. Pelvic pain.  2. Dysmenorrhea.  3. Dyspareunia.   POSTOPERATIVE DIAGNOSES:  1. Pelvic pain.  2. Dysmenorrhea.  3. Dyspareunia.   PROCEDURE:  Diagnostic laparoscopy, lysis of left adnexal adhesions.   SURGEON:  Lenoard Aden, M.D.   ANESTHESIA:  General.   ESTIMATED BLOOD LOSS:  Less was 50 mL.   COMPLICATIONS:  None.   DRAINS:  Foley which is taken out postoperatively.   COUNTS:  Correct.   Patient to recovery room in good condition.   DESCRIPTION OF PROCEDURE:  After being apprised of risks of anesthesia,  infection, bleeding, injury to abdominal organs with need for repair,  delayed versus immediate complications to include bowel and bladder  injury, inability to cure pelvic pain, patient was brought to the  operating room where she was administered general anesthetic without  complications, prepped and draped in usual sterile fashion.  Foley  catheter placed.  Hulka tenaculum placed per vagina.  Infraumbilical  incision made with a scalpel.  Veress needle placed.  Opening pressure -  2 noted, 4 liters CO2 insufflated without difficulty.  Trocar placed.  Atraumatic trocar entry visualized.  Absent gallbladder, appendix not  visualized but no adhesions in the appendiceal bed noted.  At this time  attention was turned to the pelvis which reveals bilateral normal tubes,  normal ovaries, normal anterior and posterior cul-de-sac.  One 5 mm site  placed in the midline along the previous C-section incision.  Manipulation reveals some left adnexal adhesions of the bowel mesentery  which were lysed  bluntly, using a probe, freeing up the left adnexa  which then appears to be normal with a left tubal fimbriae and the right  tubal fimbria found to be delicate and normal, bilateral  normal ovaries as well.  At this time, all instruments are removed under  direct visualization.  CO2 is released.  Incision is closed using 0  Vicryl and Dermabond.  Instruments removed from the vagina.  The patient  tolerates procedure well and is transferred to recovery in good  condition.      Lenoard Aden, M.D.  Electronically Signed     RJT/MEDQ  D:  07/03/2006  T:  07/03/2006  Job:  [id number]

## 2011-06-29 ENCOUNTER — Encounter: Payer: Self-pay | Admitting: Internal Medicine

## 2011-06-29 ENCOUNTER — Ambulatory Visit (INDEPENDENT_AMBULATORY_CARE_PROVIDER_SITE_OTHER): Payer: Managed Care, Other (non HMO) | Admitting: Internal Medicine

## 2011-06-29 VITALS — BP 110/70 | HR 109 | Temp 98.8°F | Wt 142.0 lb

## 2011-06-29 DIAGNOSIS — J069 Acute upper respiratory infection, unspecified: Secondary | ICD-10-CM | POA: Insufficient documentation

## 2011-06-29 DIAGNOSIS — Z880 Allergy status to penicillin: Secondary | ICD-10-CM

## 2011-06-29 DIAGNOSIS — J329 Chronic sinusitis, unspecified: Secondary | ICD-10-CM | POA: Insufficient documentation

## 2011-06-29 MED ORDER — HYDROCODONE-HOMATROPINE 5-1.5 MG/5ML PO SYRP: 5.0000 mL | ORAL_SOLUTION | ORAL | Status: AC | PRN

## 2011-06-29 MED ORDER — AZITHROMYCIN 250 MG PO TABS: 250.0000 mg | ORAL_TABLET | ORAL | Status: AC

## 2011-06-29 NOTE — Patient Instructions (Signed)
Continue saline and decongestants  Cough med as needed. Antibiotic for sinusitis Expect improvement in the face pressure in the next 3-5 days .  Cough could continue for  Another week or 2 .

## 2011-06-29 NOTE — Progress Notes (Signed)
  Subjective:    Patient ID: Erica Saunders, female    DOB: 08-24-1970, 41 y.o.   MRN: [id number]  HPI Patient comes in today for SDA for  new problem evaluation. Probably cold about 7+ days ago. And now getting worse  And now had head pressure and face pain  And not getting better . Hard to sleep.  Cough is an issues.   Fever this am 100 last pm.  Tried old cough med  Tried left over hydrocodone.  Zyrtec deongestant.  Some help but continueing and worse.  coughin and blowing phelgm.  Family has been sick but is better .  Review of Systems Neg vision change fever now chills   No NVD  No new rashes   Hx of swelling and rash with  pcn hasnt atken cephalospsroin.  Can take axithro had vomiting with biaxin    Objective:   Physical Exam BP 110/70  Pulse 109  Temp 98.8 F (37.1 C)  Wt 142 lb (64.411 kg)  SpO2 98% WDWN in NAD  quiet respirations; moderately congested  somewhat hoarse. Non toxic . HEENT: Normocephalic ;atraumatic , Eyes;  PERRL, EOMs  Full, lids and conjunctiva clear,,Ears: no deformities, canals nl, TM landmarks normal, Nose: no deformity or discharge but congested;face minimally tender  Right side of maxilla an frontal Mouth : OP clear without lesion or edema . Neck: Supple without adenopathy or masses or bruits Chest:  Clear to A&P without wheezes rales or rhonchi CV:  S1-S2 no gallops or murmurs peripheral perfusion is normal Skin :nl perfusion and no acute rashes   Purplish distal toes ( old ) hands normal      Assessment & Plan:    Prolonged   Uri with worsening   sinsutis  Secondary 9( primary  viral infection.)  Can add antibiotic  Second line cause of pcn allergy To regimen  as directed .   Risk benefit of medication discussed.

## 2011-06-30 ENCOUNTER — Telehealth: Payer: Self-pay | Admitting: Internal Medicine

## 2011-06-30 MED ORDER — HYDROCOD POLST-CHLORPHEN POLST 10-8 MG/5ML PO LQCR: 5.0000 mL | Freq: Two times a day (BID) | ORAL | Status: DC | PRN

## 2011-06-30 NOTE — Telephone Encounter (Signed)
Left a message for pt to return call 

## 2011-06-30 NOTE — Telephone Encounter (Signed)
Dr. Fabian Sharp did approve Tussinex take 5 cc q 12 prn and dispense 60 cc. I called in script and spoke with pt.

## 2011-06-30 NOTE — Telephone Encounter (Signed)
Pt ha questions about cough meds called in yesterday. Pt would like to know why a different med was call in the she previously had.

## 2011-07-07 ENCOUNTER — Telehealth: Payer: Self-pay | Admitting: Internal Medicine

## 2011-07-07 NOTE — Telephone Encounter (Signed)
Unsure if switching antibiotic will help but if we are considering bacterial sinusitis  Let me see hr tomorrow.

## 2011-07-07 NOTE — Telephone Encounter (Signed)
Pt states woke up and she is having pain back in her jaw along with congestion.  Pt states she feels like she has been hit by a truck.  Pt states she is sensitive to antibotics and that is why only a five day supply was given.  Pt denies fever. Pls advise.

## 2011-07-07 NOTE — Telephone Encounter (Signed)
Pt was seen on 06-29-11 for sinusitis. Pt stated abx did not work. Pt is still having sinus pressure and headaches. [facility]

## 2011-07-08 ENCOUNTER — Ambulatory Visit (INDEPENDENT_AMBULATORY_CARE_PROVIDER_SITE_OTHER): Payer: Managed Care, Other (non HMO) | Admitting: Internal Medicine

## 2011-07-08 ENCOUNTER — Encounter: Payer: Self-pay | Admitting: Internal Medicine

## 2011-07-08 VITALS — BP 118/80 | HR 60 | Temp 97.9°F | Wt 143.0 lb

## 2011-07-08 DIAGNOSIS — J329 Chronic sinusitis, unspecified: Secondary | ICD-10-CM

## 2011-07-08 DIAGNOSIS — L819 Disorder of pigmentation, unspecified: Secondary | ICD-10-CM

## 2011-07-08 DIAGNOSIS — J309 Allergic rhinitis, unspecified: Secondary | ICD-10-CM

## 2011-07-08 MED ORDER — PREDNISONE 20 MG PO TABS: ORAL_TABLET | ORAL | Status: AC

## 2011-07-08 MED ORDER — LEVOFLOXACIN 750 MG PO TABS: 750.0000 mg | ORAL_TABLET | Freq: Every day | ORAL | Status: AC

## 2011-07-08 NOTE — Telephone Encounter (Signed)
Pt is sch for 3pm today

## 2011-07-08 NOTE — Progress Notes (Signed)
  Subjective:    Patient ID: Erica Saunders, female    DOB: 08-05-1970, 41 y.o.   MRN: [id number]  HPI She comes   into the office today because of continued problems with upper respiratory cough and sinus pressure. See phone note from yesterday. She is continued intermittent but severe face pressure with copious nasal mucous without associated fever. However sometimes the ankle her jaw hurts tightness. She is taking the antibiotic which is azithromycin with no bad side effects today is the first day she has felt better. She is 9 days into the medication.  He has history of allergy rhinitis but isn't really sure that this is related. No vomiting vision change hasn't drainage cough. No shortness of breath or wheezing  Review of Systems  no fever or weight loss does turn purplish red nodes like she uses a yeast on her toes at night because they feel warm and she uses ice to make a cold and feel better if they will swell up if she does not do that although her symptoms get better in the summer.  Past history family history social history reviewed in the electronic medical record.       Objective:   Physical Exam BP 118/80  Pulse 60  Temp(Src) 97.9 F (36.6 C) (Oral)  Wt 143 lb (64.864 kg)  SpO2 98% WDWN in NAD  quiet respirations;  congested  somewhat hoarse. Non toxic . HEENT: Normocephalic ;atraumatic , Eyes;  PERRL, EOMs  Full, lids and conjunctiva clear,,Ears: no deformities, canals nl, TM landmarks normal, Nose:  but congested;face  Tender maxillar right more than left ans some frontal  Mouth : OP clear without lesion or edema . Neck: Supple without adenopathy or masses or bruits Chest:  Clear to A&P without wheezes rales or rhonchi CV:  S1-S2 no gallops or murmurs peripheral perfusion is normal Skin :no acute rashes  Middle toes purplish distally  Hands are normally perfused      Assessment & Plan:  Persistent upper respiratory infection symptoms sinusitis.  Possible underlying  allergy slight improvement over the last 8 days.  Options discussed this is before weekend. Prescriptions given to her for 5 days of prednisone to decrease sinus inflammation and if needed Levaquin treatment for bacterial infection although there is a risk of side effects wit She is a runner discussed  risk oftendinopathy    She should followup if not getting better as discussed.   Discoloration of toes seems to be temperature related however she is using ice on them to feel better discussed with her the risk of frostbite type of damage to her capillaries and that she should not do that she can use cool or warm but not extremes in temperature especially on an area that might be vascularly compromised.   Followup at her regular appointments

## 2011-07-08 NOTE — Patient Instructions (Signed)
If not improving  Add prednisone and antibiotic    Call if needed  If  persistent or progressive we may get a sinus ct scan.  Or referral

## 2011-09-19 ENCOUNTER — Ambulatory Visit (INDEPENDENT_AMBULATORY_CARE_PROVIDER_SITE_OTHER): Payer: Managed Care, Other (non HMO) | Admitting: Internal Medicine

## 2011-09-19 ENCOUNTER — Encounter: Payer: Self-pay | Admitting: Internal Medicine

## 2011-09-19 VITALS — BP 114/80 | HR 86 | Temp 98.2°F | Wt 144.0 lb

## 2011-09-19 DIAGNOSIS — Z8742 Personal history of other diseases of the female genital tract: Secondary | ICD-10-CM | POA: Insufficient documentation

## 2011-09-19 DIAGNOSIS — R7689 Other specified abnormal immunological findings in serum: Secondary | ICD-10-CM | POA: Insufficient documentation

## 2011-09-19 DIAGNOSIS — F4010 Social phobia, unspecified: Secondary | ICD-10-CM

## 2011-09-19 DIAGNOSIS — Z8759 Personal history of other complications of pregnancy, childbirth and the puerperium: Secondary | ICD-10-CM

## 2011-09-19 DIAGNOSIS — F411 Generalized anxiety disorder: Secondary | ICD-10-CM

## 2011-09-19 DIAGNOSIS — R768 Other specified abnormal immunological findings in serum: Secondary | ICD-10-CM

## 2011-09-19 DIAGNOSIS — N3010 Interstitial cystitis (chronic) without hematuria: Secondary | ICD-10-CM

## 2011-09-19 DIAGNOSIS — Z79899 Other long term (current) drug therapy: Secondary | ICD-10-CM

## 2011-09-19 DIAGNOSIS — R894 Abnormal immunological findings in specimens from other organs, systems and tissues: Secondary | ICD-10-CM

## 2011-09-19 MED ORDER — ALPRAZOLAM 0.25 MG PO TABS: 0.2500 mg | ORAL_TABLET | Freq: Every day | ORAL | Status: DC | PRN

## 2011-09-19 NOTE — Assessment & Plan Note (Signed)
Has used 4-5 x per week recently needs refill will follow  .  intervnention as needed.

## 2011-09-19 NOTE — Patient Instructions (Signed)
refill med today and monitor medication.   Call for refills . If progressing consider trying a controller medication.

## 2011-09-19 NOTE — Progress Notes (Signed)
  Subjective:    Patient ID: Erica Saunders, female    DOB: 28-Nov-1970, 41 y.o.   MRN: [id number]  HPI Pr come in fot get rx for xanax which she has used for a while for prn panic situations.  Onset of panic attacks when in HS  Remote hx of hospitalization  In hs and then got evaluated in grad school.     [place]  Age 33s.  Clinical SW.   Hx of paxil  For  Had se of dreams  And hard  And then citalopram . prozac  Hives and ma hew anxious An had facial tics. And had other issues   Pos ana  Plus and minus.    Usually in down time. .   Now  Uses meds prn to avoid panic. Hx of controller.   antidepressant meds.  MOM is anxious personality . Has some agoraphobia . Marland Kitchen Cold sweat an vomits  And abdominal  Cramps.   Lasts for hours. Xanax and rest and resolves .No rebound.   Noted.   Now using a bit more about 4-5 x per week. Used to be  A few times a month.  Usually relaxing in a social  setting. Triggers not  During high stress. Usually dr Vincente Poli and Dr Cato Mulligan.  rx in the past . Asks for this provider to take over. Has cpx PV visit in the fall   Review of Systems Neg current cp sob  . Syncope.ibs stable no bleeding psychosis substance use  1 glass wine per night. No excess caffiene.   Past history family history social history reviewed in the electronic medical record. Interstitial cystitis and ibs      Objective:   Physical Exam BP 114/80  Pulse 86  Temp 98.2 F (36.8 C) (Oral)  Wt 144 lb (65.318 kg)  SpO2 98%  WDWN in nad   Oriented x 3. Normal cognition, attention, speech. Not anxious or depressed appearing   Good eye contact . Oriented x 3 and no noted deficits in memory, attention, and speech.     Assessment & Plan:  Anxiety disorder longstanding . Functional . Transfer of med management .  Hx of panic seems to be casual social situation  .  Prefers to remain on current prn usage of meds  Risk benefit of medication discussed. And pt fully aware. No really interested in controller  med at this time for the reasons discussed . Pt aware may need to revisit in future if increase need for meds.   rx 30 and 1 refill .

## 2011-11-23 ENCOUNTER — Other Ambulatory Visit: Payer: Managed Care, Other (non HMO)

## 2011-11-30 ENCOUNTER — Encounter: Payer: Managed Care, Other (non HMO) | Admitting: Internal Medicine

## 2012-01-23 ENCOUNTER — Telehealth: Payer: Self-pay | Admitting: Family Medicine

## 2012-01-23 NOTE — Telephone Encounter (Signed)
Ok to refill x 1  

## 2012-01-23 NOTE — Telephone Encounter (Signed)
Pt last seen on 09/19/11 and filled on that day #30 with 1 additional refill.  Coming in on 02/15/12 for CPX.  Pt requesting refills.  Please advise.  Thanks!!!

## 2012-01-24 ENCOUNTER — Other Ambulatory Visit: Payer: Self-pay | Admitting: Internal Medicine

## 2012-01-24 MED ORDER — ALPRAZOLAM 0.25 MG PO TABS: 0.2500 mg | ORAL_TABLET | Freq: Every day | ORAL | Status: DC | PRN

## 2012-01-24 NOTE — Telephone Encounter (Signed)
Called to the pharmacy and left on voicemail. 

## 2012-02-08 ENCOUNTER — Other Ambulatory Visit (INDEPENDENT_AMBULATORY_CARE_PROVIDER_SITE_OTHER): Payer: Managed Care, Other (non HMO)

## 2012-02-08 DIAGNOSIS — Z0000 Encounter for general adult medical examination without abnormal findings: Secondary | ICD-10-CM

## 2012-02-08 LAB — CBC WITH DIFFERENTIAL/PLATELET
Basophils Absolute: 0 10*3/uL (ref 0.0–0.1)
Eosinophils Absolute: 0 10*3/uL (ref 0.0–0.7)
Lymphocytes Relative: 31.9 % (ref 12.0–46.0)
MCHC: 32.6 g/dL (ref 30.0–36.0)
MCV: 90.2 fl (ref 78.0–100.0)
Monocytes Absolute: 0.4 10*3/uL (ref 0.1–1.0)
Neutrophils Relative %: 56.9 % (ref 43.0–77.0)
RDW: 13.6 % (ref 11.5–14.6)

## 2012-02-08 LAB — BASIC METABOLIC PANEL
BUN: 15 mg/dL (ref 6–23)
CO2: 28 mEq/L (ref 19–32)
Calcium: 9.5 mg/dL (ref 8.4–10.5)
Creatinine, Ser: 0.9 mg/dL (ref 0.4–1.2)
Glucose, Bld: 88 mg/dL (ref 70–99)

## 2012-02-08 LAB — LIPID PANEL
HDL: 105.9 mg/dL (ref >39.00)
VLDL: 8.4 mg/dL (ref 0.0–40.0)

## 2012-02-08 LAB — HEPATIC FUNCTION PANEL
Alkaline Phosphatase: 52 U/L (ref 39–117)
Bilirubin, Direct: 0.1 mg/dL (ref 0.0–0.3)

## 2012-02-08 LAB — POCT URINALYSIS DIPSTICK
Bilirubin, UA: NEGATIVE
Blood, UA: NEGATIVE
Nitrite, UA: NEGATIVE
Protein, UA: NEGATIVE
pH, UA: 7

## 2012-02-08 LAB — TSH: TSH: 2.38 u[IU]/mL (ref 0.35–5.50)

## 2012-02-08 LAB — LDL CHOLESTEROL, DIRECT: Direct LDL: 90.2 mg/dL

## 2012-02-15 ENCOUNTER — Encounter: Payer: Self-pay | Admitting: Internal Medicine

## 2012-02-15 ENCOUNTER — Encounter: Payer: Managed Care, Other (non HMO) | Admitting: Internal Medicine

## 2012-02-15 ENCOUNTER — Ambulatory Visit (INDEPENDENT_AMBULATORY_CARE_PROVIDER_SITE_OTHER): Payer: Managed Care, Other (non HMO) | Admitting: Internal Medicine

## 2012-02-15 VITALS — BP 110/80 | HR 77 | Temp 98.3°F | Ht 64.0 in | Wt 148.0 lb

## 2012-02-15 DIAGNOSIS — Z0000 Encounter for general adult medical examination without abnormal findings: Secondary | ICD-10-CM

## 2012-02-15 DIAGNOSIS — F411 Generalized anxiety disorder: Secondary | ICD-10-CM

## 2012-02-15 DIAGNOSIS — I7381 Erythromelalgia: Secondary | ICD-10-CM

## 2012-02-15 DIAGNOSIS — N3010 Interstitial cystitis (chronic) without hematuria: Secondary | ICD-10-CM

## 2012-02-15 DIAGNOSIS — R109 Unspecified abdominal pain: Secondary | ICD-10-CM

## 2012-02-15 DIAGNOSIS — G4700 Insomnia, unspecified: Secondary | ICD-10-CM | POA: Insufficient documentation

## 2012-02-15 DIAGNOSIS — N63 Unspecified lump in unspecified breast: Secondary | ICD-10-CM

## 2012-02-15 DIAGNOSIS — J309 Allergic rhinitis, unspecified: Secondary | ICD-10-CM

## 2012-02-15 DIAGNOSIS — Z8719 Personal history of other diseases of the digestive system: Secondary | ICD-10-CM | POA: Insufficient documentation

## 2012-02-15 DIAGNOSIS — N6325 Unspecified lump in the left breast, overlapping quadrants: Secondary | ICD-10-CM | POA: Insufficient documentation

## 2012-02-15 DIAGNOSIS — Z8742 Personal history of other diseases of the female genital tract: Secondary | ICD-10-CM

## 2012-02-15 DIAGNOSIS — Z23 Encounter for immunization: Secondary | ICD-10-CM

## 2012-02-15 DIAGNOSIS — K594 Anal spasm: Secondary | ICD-10-CM | POA: Insufficient documentation

## 2012-02-15 LAB — POCT URINALYSIS DIPSTICK
Bilirubin, UA: NEGATIVE
Leukocytes, UA: NEGATIVE
Nitrite, UA: NEGATIVE
Protein, UA: NEGATIVE
pH, UA: 6

## 2012-02-15 MED ORDER — FLUTICASONE PROPIONATE 50 MCG/ACT NA SUSP: NASAL | Status: DC

## 2012-02-15 MED ORDER — TRAZODONE HCL 50 MG PO TABS: 50.0000 mg | ORAL_TABLET | Freq: Every evening | ORAL | Status: DC | PRN

## 2012-02-15 MED ORDER — PHENAZOPYRIDINE HCL 200 MG PO TABS: 200.0000 mg | ORAL_TABLET | Freq: Three times a day (TID) | ORAL | Status: DC | PRN

## 2012-02-15 NOTE — Patient Instructions (Addendum)
Can refill the pyridium  For now . Flonase  rx . See GI   Will refer      Concern about x anax use on a regular basis.  Try trazadone 50 mg at night and can increase to 75  Mg at night . Will think about other options for the hand feet pain.  Will refer for diagnostic for breast lump breast center.

## 2012-02-15 NOTE — Progress Notes (Signed)
Chief Complaint  Patient presents with  . Annual Exam    Multiple concerns    HPI: Patient comes in today for Preventive Health Care visit  No major change in health status since last visit . Has a number of issues of concern  Hx of blood in stool in remote past and never went to gi as directed as went away but now And now  Coming back and some bowel and bladder issues  . Complaining of pain and dysfunction in bowel and bladder at the same time a non-first-degree family member had colon cancer.  Interstitial cystitis history of treatment treatment helped in the past delaying going back to her urologist uses Pyridium occasionally once a month asks for refill to use as needed so she can sleep at night if needed   " neuropathy issues  "Hand and feet feel burning and painful when hot. Has color changes also  ? Raynauds. Was seen in high school and was told she could have her nodes. However cold is not her problem. She calls her symptoms neuropathy but is never seen a neurologist or diagnosed specifically with that. Situations are of vasodilatation causing pain although exercising or running does not. A small glass of red wine makes her feet hurt and his color ?" If neuropathy " dx in hs in   vasodilitation hurts.  Saw Dr Durenda Age  At some point  .  Because she had a positive ANA but has no specific diagnosis of collagen vascular disease had no symptoms when she saw the rheumatologist she also saw a rheumatologist at [facility] and had a bad experience. Was not helpful. Her gynecologist is encouraged her to see a rheumatologist at [facility] this has been delayed for various reasons. She is a bit hesitant to go. Not convinced that it would be a helpful valuation.  She has lumpy breasts and history of a breast cyst in the past she is due for mammogram in January has noticed a breast lump on her left it could be a cyst. She has had a hysterectomy for endometriosis.  Asks about allergy symptoms has been on  nasal cortisone was in the remote past otherwise oral meds that could side effects.  Anxiety panic symptoms and she was in high school had side effects of antidepressants Prozac caused hives Celexa caused tics Paxil didn't work after a while her had another side effects she's never used Zoloft or Lexapro is a bit fearful of controller medicines uses Xanax as needed a half  Sleep problems and difficult she's been using a half a Xanax 0.2 5 at night to sleep she's not happy with the situation however is more fearful of other medicines and unsure what else to do. She does regular exercise. Uncertain what else would help.    ROS:  GEN/ HEENT: No fever, significant weight changes sweats headaches vision problems hearing changes, CV/ PULM; No chest pain shortness of breath cough, syncope,edema  change in exercise tolerance. GI /GU: No adominal pain, vomiting, change in bowel habits. No blood in the stool. No significant GU symptoms. SKIN/HEME: ,no acute skin rashes suspicious lesions or bleeding. No lymphadenopathy, nodules, masses.  NEURO/ PSYCH:  No neurologic signs such as weakness numbness. No depression anxiety. IMM/ Allergy: No unusual infections.  Allergy .   REST of 12 system review negative except as per HPI  Family history of connective tissue disorders.  Past Medical History  Diagnosis Date  . ANXIETY DISORDER, GENERALIZED 10/10/2006    hospitalized  about age 50 after child  wih panic episode  . Irritable bowel syndrome 10/10/2006  . ALLERGIC RHINITIS CAUSE UNSPECIFIED 04/09/2008  . 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 Erythromelalgia 04/22/2009  . Hx of endometriosis   . History of miscarriage     x 2   . ANA positive     Family History  Problem Relation Age of Onset  . Lupus Brother   . Anxiety disorder Mother     History   Social History  . Marital Status: Married    Spouse Name: N/A    Number of Children: N/A  . Years of Education: N/A   Social History Main Topics    . Smoking status: Never Smoker   . Smokeless tobacco: Never Used  . Alcohol Use: 1.5 oz/week    3 drink(s) per week  . Drug Use: No  . Sexually Active: None   Other Topics Concern  . None   Social History Narrative   hh of 3 Wine at night max 1 Neg td caffiene in am Employed. MSW  Married    Outpatient Prescriptions Prior to Visit  Medication Sig Dispense Refill  . ALPRAZolam (XANAX) 0.25 MG tablet Take 1 tablet (0.25 mg total) by mouth daily as needed.  30 tablet  0  . cetirizine (ZYRTEC) 10 MG tablet Take 10 mg by mouth daily.        . Multiple Vitamin (MULTIVITAMIN) tablet Take 1 tablet by mouth daily.        . phenazopyridine (PYRIDIUM) 200 MG tablet Take 200 mg by mouth 3 (three) times daily as needed.           EXAM:  BP 110/80  Pulse 77  Temp 98.3 F (36.8 C) (Oral)  Ht 5\' 4"  (1.626 m)  Wt 148 lb (67.132 kg)  BMI 25.40 kg/m2  SpO2 97%  Body mass index is 25.40 kg/(m^2).  Physical Exam: Vital signs reviewed WUJ:WJXB is a well-developed well-nourished alert cooperative   female who appears her stated age in no acute distress.  HEENT: normocephalic atraumatic , Eyes: PERRL EOM's full, conjunctiva clear, Nares: paten,t no deformity discharge or tenderness., Ears: no deformity EAC's clear TMs with normal landmarks. Mouth: clear OP, no lesions, edema.  Moist mucous membranes. Dentition in adequate repair. NECK: supple without masses, thyromegaly or bruits. CHEST/PULM:  Clear to auscultation and percussion breath sounds equal no wheeze , rales or rhonchi. No chest wall deformities or tenderness. CV: PMI is nondisplaced, S1 S2 no gallops, murmurs, rubs. Peripheral pulses are full without delay.No JVD .  Breast:  Lumpiness throughout however on the left about 6:00 there is a 1.5 cm ovoid cystic-type lesion it is mobile. No obvious axillary adenopathy. ABDOMEN: Bowel sounds normal nontender  No guard or rebound, no hepato splenomegal no CVA tenderness.  No  hernia. Extremtities:  No clubbing cyanosis or edema, no acute joint swelling  no focal atrophy NEURO:  Oriented x3, cranial nerves 3-12 appear to be intact, no obvious focal weakness,gait within normal limits no abnormal reflexes or asymmetrical SKIN: No acute rashes normal turgor, , no bruising or petechiae. Has reddening dusky pink color of digits mostly feet but pulses are intact. PSYCH: Oriented, good eye contact, no obvious depression minimal anxiety, cognition and judgment appear normal. LN: no cervical axillary inguinal adenopathy  Lab Results  Component Value Date   WBC 4.5 02/08/2012   HGB 13.2 02/08/2012   HCT 40.6 02/08/2012   PLT 316.0 02/08/2012   GLUCOSE 88 02/08/2012  CHOL 211* 02/08/2012   TRIG 42.0 02/08/2012   HDL 105.90 02/08/2012   LDLDIRECT 90.2 02/08/2012   ALT 19 02/08/2012   AST 23 02/08/2012   NA 138 02/08/2012   K 4.6 02/08/2012   CL 102 02/08/2012   CREATININE 0.9 02/08/2012   BUN 15 02/08/2012   CO2 28 02/08/2012   TSH 2.38 02/08/2012    ASSESSMENT AND PLAN:  Discussed the following assessment and plan:  1. Visit for preventive health examination    2. Breast lump on left side at 6 o'clock position  MM Digital Diagnostic Bilat, US Breast Left   cystic breasts   3. Rectal spasm  Ambulatory referral to Gastroenterology   > with baldder dysnfunction some rectal blood  need gi evaluation  4. Abdominal pain  POC Urinalysis Dipstick  5. Need for prophylactic vaccination and inoculation against influenza    6. History of rectal bleeding  Ambulatory referral to Gastroenterology  7. INTERSTITIAL CYSTITIS     refill pyridium see uro when needed  8. ANXIETY DISORDER, GENERALIZED     panic se meds prozac celexa not been on zoloft or lexapro ? paxil some help remotely.   9. ALLERGIC RHINITIS CAUSE UNSPECIFIED     rx flonase trial for control to avoid deongestants   10. Erythromelalgia     problematic stable hx of ana pos but no dx of ct disease  11.  Hx of endometriosis    12. Insomnia     disc options has been using xanax for sleep not a long term solution trial trazadone   Pap  Flu  Hx of  IC  Bowel bladder dysfunction at intermittent.   Patient Instructions  Can refill the pyridium  For now . Flonase  rx . See GI   Will refer      Concern about x anax use on a regular basis.  Try trazadone 50 mg at night and can increase to 75  Mg at night . Will think about other options for the hand feet pain.  Will refer for diagnostic for breast lump breast center.   Ref gi referral.   Neta Mends. Panosh M.D.

## 2012-02-20 ENCOUNTER — Encounter: Payer: Managed Care, Other (non HMO) | Admitting: Internal Medicine

## 2012-02-24 ENCOUNTER — Ambulatory Visit
Admission: RE | Admit: 2012-02-24 | Discharge: 2012-02-24 | Disposition: A | Discharge status: Home / Self Care | Payer: Managed Care, Other (non HMO) | Source: Ambulatory Visit | Attending: Internal Medicine | Admitting: Internal Medicine

## 2012-02-24 DIAGNOSIS — N6325 Unspecified lump in the left breast, overlapping quadrants: Secondary | ICD-10-CM

## 2012-03-08 ENCOUNTER — Other Ambulatory Visit: Payer: Self-pay | Admitting: *Deleted

## 2012-03-14 ENCOUNTER — Encounter: Payer: Self-pay | Admitting: Gastroenterology

## 2012-03-14 ENCOUNTER — Ambulatory Visit (INDEPENDENT_AMBULATORY_CARE_PROVIDER_SITE_OTHER): Payer: Managed Care, Other (non HMO) | Admitting: Gastroenterology

## 2012-03-14 VITALS — BP 110/60 | HR 92 | Ht 64.0 in | Wt 147.0 lb

## 2012-03-14 DIAGNOSIS — R1310 Dysphagia, unspecified: Secondary | ICD-10-CM | POA: Insufficient documentation

## 2012-03-14 DIAGNOSIS — Z8719 Personal history of other diseases of the digestive system: Secondary | ICD-10-CM

## 2012-03-14 MED ORDER — OMEPRAZOLE 20 MG PO CPDR: 20.0000 mg | DELAYED_RELEASE_CAPSULE | Freq: Every day | ORAL | Status: DC

## 2012-03-14 NOTE — Patient Instructions (Addendum)
You have been scheduled for a colonoscopy with propofol. Please follow written instructions given to you at your visit today.  Please pick up your prep kit at the pharmacy within the next 1-3 days. If you use inhalers (even only as needed) or a CPAP machine, please bring them with you on the day of your procedure.  You have been scheduled for an endoscopy with propofol. Please follow written instructions given to you at your visit today. If you use inhalers (even only as needed) or a CPAP machine, please bring them with you on the day of your procedure.  Prilosec samples and prescriptions

## 2012-03-14 NOTE — Assessment & Plan Note (Signed)
Symptoms could be do to hemorrhoidal disease. More proximal colonic bleeding source should be ruled out.  Recommendations #1 colonoscopy

## 2012-03-14 NOTE — Assessment & Plan Note (Signed)
Rule out early peptic stricture  Recommendations #1 begin omeprazole 20 mg daily #2 upper endoscopy with dilatation as indicated

## 2012-03-14 NOTE — Progress Notes (Signed)
History of Present Illness: Pleasant 41 year old white female referred at the request of Dr. Darrell Jewel for evaluation of rectal bleeding.  For several months she has noticed intermittent bright red blood per rectum consisting of blood mixed with her stools. She denies change of bowel habits, abdominal or rectal pain.  She does pass mucus. She's also complaining of mild pyrosis with dysphagia to solids. She may have moderate odynophagia as well.    Past Medical History  Diagnosis Date  . ANXIETY DISORDER, GENERALIZED 10/10/2006    hospitalized  about age 46 after child  wih panic episode  . Irritable bowel syndrome 10/10/2006  . ALLERGIC RHINITIS CAUSE UNSPECIFIED 04/09/2008  . 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 Erythromelalgia 04/22/2009  . Hx of endometriosis   . History of miscarriage     x 2   . ANA positive    Past Surgical History  Procedure Date  . Abdominal hysterectomy   . Appendectomy   . Cholecystectomy   . Tonsilectomy, adenoidectomy, bilateral myringotomy and tubes   . Laparoscopy     endometriosis  . Dilation and curettage of uterus    family history includes Anxiety disorder in her mother; Colon cancer in her paternal grandfather; and Lupus in her brother. Current Outpatient Prescriptions  Medication Sig Dispense Refill  . ALPRAZolam (XANAX) 0.25 MG tablet Take 1 tablet (0.25 mg total) by mouth daily as needed.  30 tablet  0  . cetirizine (ZYRTEC) 10 MG tablet Take 10 mg by mouth daily.        . fluticasone (FLONASE) 50 MCG/ACT nasal spray 2 spray each nostril qd  16 g  3  . Ibuprofen (ADVIL) 200 MG CAPS Take by mouth. Four a day      . Multiple Vitamin (MULTIVITAMIN) tablet Take 1 tablet by mouth daily.        . phenazopyridine (PYRIDIUM) 200 MG tablet Take 1 tablet (200 mg total) by mouth 3 (three) times daily as needed.  15 tablet  1   Allergies as of 03/14/2012 - Review Complete 03/14/2012  Allergen Reaction Noted  . Celexa (citalopram hydrobromide)  02/15/2012  .  Penicillins  03/19/2007  . Prozac (fluoxetine hcl) Hives 02/15/2012    reports that she has never smoked. She has never used smokeless tobacco. She reports that she drinks about 1.5 ounces of alcohol per week. She reports that she does not use illicit drugs.     Review of Systems: She occasionally has difficulty initiating urinating or moving her bowels. This may lead to lower bowel discomfort. Pertinent positive and negative review of systems were noted in the above HPI section. All other review of systems were otherwise negative.  Vital signs were reviewed in today's medical record Physical Exam: General: Well developed , well nourished, no acute distress Head: Normocephalic and atraumatic Eyes:  sclerae anicteric, EOMI Ears: Normal auditory acuity Mouth: No deformity or lesions Neck: Supple, no masses or thyromegaly Lungs: Clear throughout to auscultation Heart: Regular rate and rhythm; no murmurs, rubs or bruits Abdomen: Soft, non tender and non distended. No masses, hepatosplenomegaly or hernias noted. Normal Bowel sounds Rectal:deferred Musculoskeletal: Symmetrical with no gross deformities  Skin: No lesions on visible extremities Pulses:  Normal pulses noted Extremities: No clubbing, cyanosis, edema or deformities noted Neurological: Alert oriented x 4, grossly nonfocal Cervical Nodes:  No significant cervical adenopathy Inguinal Nodes: No significant inguinal adenopathy Psychological:  Alert and cooperative. Normal mood and affect

## 2012-03-15 ENCOUNTER — Ambulatory Visit (AMBULATORY_SURGERY_CENTER): Payer: Managed Care, Other (non HMO) | Admitting: Gastroenterology

## 2012-03-15 ENCOUNTER — Encounter: Payer: Self-pay | Admitting: Gastroenterology

## 2012-03-15 ENCOUNTER — Other Ambulatory Visit: Payer: Self-pay | Admitting: Internal Medicine

## 2012-03-15 VITALS — BP 108/67 | HR 63 | Temp 98.8°F | Resp 15 | Ht 64.0 in | Wt 147.0 lb

## 2012-03-15 DIAGNOSIS — K648 Other hemorrhoids: Secondary | ICD-10-CM

## 2012-03-15 DIAGNOSIS — K625 Hemorrhage of anus and rectum: Secondary | ICD-10-CM

## 2012-03-15 MED ORDER — HYDROCORTISONE ACETATE 25 MG RE SUPP: 25.0000 mg | Freq: Two times a day (BID) | RECTAL | Status: DC

## 2012-03-15 MED ORDER — SODIUM CHLORIDE 0.9 % IV SOLN: 500.0000 mL | INTRAVENOUS | Status: DC

## 2012-03-15 NOTE — Op Note (Signed)
[facility] [address] [place], [address]   COLONOSCOPY PROCEDURE REPORT  PATIENT: Erica Saunders, Erica Saunders  MR#: [id number] BIRTHDATE: 1970-10-13 , 41  yrs. old GENDER: Female ENDOSCOPIST: Louis Meckel, MD REFERRED MW:NUUVO Lonie Peak, M.D. PROCEDURE DATE:  03/15/2012 PROCEDURE:   Colonoscopy, diagnostic ASA CLASS:   Class II INDICATIONS:Rectal Bleeding. MEDICATIONS: MAC sedation, administered by CRNA and propofol (Diprivan) 300mg  IV  DESCRIPTION OF PROCEDURE:   After the risks benefits and alternatives of the procedure were thoroughly explained, informed consent was obtained.  A digital rectal exam revealed no abnormalities of the rectum.   The LB [id number]  endoscope was introduced through the anus and advanced to the cecum, which was identified by both the appendix and ileocecal valve. No adverse events experienced.   The quality of the prep was Suprep excellent The instrument was then slowly withdrawn as the colon was fully examined.      COLON FINDINGS: Internal hemorrhoids were found.   The colon mucosa was otherwise normal.  Retroflexed views revealed no abnormalities. The time to cecum=8 minutes 23 seconds.  Withdrawal time=6 minutes 30 seconds.  The scope was withdrawn and the procedure completed. COMPLICATIONS: There were no complications.  ENDOSCOPIC IMPRESSION: 1.   Internal hemorrhoids 2.   The colon mucosa was otherwise normal  Limited rectal bleeding likely hemorrhoidal   RECOMMENDATIONS: Anusol suppositories Colonoscopy 10 years   eSigned:  Louis Meckel, MD 03/15/2012 10:26 AM   cc:

## 2012-03-15 NOTE — Patient Instructions (Addendum)

## 2012-03-15 NOTE — Progress Notes (Addendum)
Patient did not have preoperative order for IV antibiotic SSI prophylaxis. (G8918)  Patient did not experience any of the following events: a burn prior to discharge; a fall within the facility; wrong site/side/patient/procedure/implant event; or a hospital transfer or hospital admission upon discharge from the facility. (G8907)  

## 2012-03-15 NOTE — Progress Notes (Signed)
Propofol given over incremental dosages 

## 2012-03-15 NOTE — Telephone Encounter (Signed)
RN attempted to contact patient, left a vm.  °

## 2012-03-15 NOTE — Progress Notes (Signed)
Pt states she has had some circulation issues lately; her hands and feet can turn purple at times

## 2012-03-16 ENCOUNTER — Telehealth: Payer: Self-pay | Admitting: *Deleted

## 2012-03-16 ENCOUNTER — Telehealth: Payer: Self-pay | Admitting: Family Medicine

## 2012-03-16 NOTE — Telephone Encounter (Signed)
This patient is requesting refills.  She was last seen on 02/15/12.  Last filled on 01/24/12 # 30 with 0 additional refills.  Has no future appt scheduled.  Please advise.  Thanks!!

## 2012-03-16 NOTE — Telephone Encounter (Signed)
Name identifier, left message, follow-up 

## 2012-03-16 NOTE — Telephone Encounter (Signed)
Message sent to Dr. Fry.  Waiting on response. 

## 2012-03-19 NOTE — Telephone Encounter (Signed)
Called to the pharmacy and left on voicemail. 

## 2012-03-19 NOTE — Telephone Encounter (Signed)
Refill x 1  Needs Ov before next refill

## 2012-03-19 NOTE — Telephone Encounter (Signed)
Pt is out of med

## 2012-04-23 ENCOUNTER — Encounter: Payer: Managed Care, Other (non HMO) | Admitting: Gastroenterology

## 2012-05-14 ENCOUNTER — Telehealth: Payer: Self-pay | Admitting: Internal Medicine

## 2012-05-14 NOTE — Telephone Encounter (Signed)
Pt needs refill on alprazolam call into [facility]

## 2012-05-14 NOTE — Telephone Encounter (Signed)
Last seen on 02/15/12 for her physical.  Last filled on 03/15/12.  Has a future appointment for 05/17/12.  Please advise.  Thanks!!

## 2012-05-14 NOTE — Telephone Encounter (Signed)
Ok to refill x 1  

## 2012-05-15 ENCOUNTER — Other Ambulatory Visit: Payer: Self-pay | Admitting: Internal Medicine

## 2012-05-15 MED ORDER — ALPRAZOLAM 0.25 MG PO TABS: ORAL_TABLET | ORAL | Status: DC

## 2012-05-15 NOTE — Telephone Encounter (Signed)
Called to the pharmacy and left on voicemail. 

## 2012-05-17 ENCOUNTER — Encounter: Payer: Self-pay | Admitting: Internal Medicine

## 2012-05-17 ENCOUNTER — Ambulatory Visit (INDEPENDENT_AMBULATORY_CARE_PROVIDER_SITE_OTHER): Payer: 59 | Admitting: Internal Medicine

## 2012-05-17 VITALS — BP 104/70 | HR 94 | Temp 98.2°F | Wt 148.0 lb

## 2012-05-17 DIAGNOSIS — F411 Generalized anxiety disorder: Secondary | ICD-10-CM

## 2012-05-17 DIAGNOSIS — M542 Cervicalgia: Secondary | ICD-10-CM

## 2012-05-17 DIAGNOSIS — J329 Chronic sinusitis, unspecified: Secondary | ICD-10-CM

## 2012-05-17 DIAGNOSIS — G4700 Insomnia, unspecified: Secondary | ICD-10-CM

## 2012-05-17 MED ORDER — CEFUROXIME AXETIL 500 MG PO TABS: 500.0000 mg | ORAL_TABLET | Freq: Two times a day (BID) | ORAL | Status: DC

## 2012-05-17 MED ORDER — HYDROCOD POLST-CHLORPHEN POLST 10-8 MG/5ML PO LQCR: 5.0000 mL | Freq: Two times a day (BID) | ORAL | Status: DC | PRN

## 2012-05-17 MED ORDER — ALPRAZOLAM 0.25 MG PO TABS: ORAL_TABLET | ORAL | Status: DC

## 2012-05-17 NOTE — Progress Notes (Signed)
Chief Complaint  Patient presents with  . Headache    Started 2wks ago.  Treating with Claritin, Ibuprofen and flonase.  She woudl like a refill of hydrocodone,/chlorphen ER cough syrup.  . Sinus pressure  . Cough  . Nasal Congestion  . Epistaxis  . Post Nasal Drip    HPI: Patient comes in today for SDA for  new problem evaluation. "I  think i have a sinus infection" head and face pressure and now decongestant keeping her up.  And not helping   Had used 24 hour decongestant for  2 weeks and still having facial pressure pain discharge nasal postnasal. Cough at night increasing no shortness of breath Left face pain more problematic. Having back of neck pressure.    "Killing.  Her"  No fever but feverish   Wants refill cough med  Helps at night once a refill of the Tussionex. Just use at night to help sleep. Tried the trazodone for sleep and made her too drowsy and was not very helpful she switched to melatonin 3 mg at night to avoid using Xanax for sleep. Now uses it only for anxiety. Would like a refill of Xanax.   ROS: See pertinent positives and negatives per HPI.  Past Medical History  Diagnosis Date  . ANXIETY DISORDER, GENERALIZED 10/10/2006    hospitalized  about age 46 after child  wih panic episode  . Irritable bowel syndrome 10/10/2006  . ALLERGIC RHINITIS CAUSE UNSPECIFIED 04/09/2008  . 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 Erythromelalgia 04/22/2009  . Hx of endometriosis   . History of miscarriage     x 2   . ANA positive   . Anxiety     Family History  Problem Relation Age of Onset  . Lupus Brother   . Anxiety disorder Mother   . Colon cancer Paternal Grandfather   . Esophageal cancer Neg Hx   . Stomach cancer Neg Hx   . Rectal cancer Neg Hx     History   Social History  . Marital Status: Married    Spouse Name: N/A    Number of Children: N/A  . Years of Education: N/A   Social History Main Topics  . Smoking status: Never Smoker   . Smokeless tobacco:  Never Used  . Alcohol Use: 1.5 oz/week    3 drink(s) per week     Comment: 1-2 drinks daily   . Drug Use: No  . Sexually Active: None   Other Topics Concern  . None   Social History Narrative   hh of 3    Wine at night max 1    Neg td    caffiene in am    Employed.    MSW  Married     Outpatient Encounter Prescriptions as of 05/17/2012  Medication Sig Dispense Refill  . ALPRAZolam (XANAX) 0.25 MG tablet TAKE 1 TABLET BY MOUTH DAILY AS NEEDED  30 tablet  1  . fluticasone (FLONASE) 50 MCG/ACT nasal spray 2 spray each nostril qd  16 g  3  . Ibuprofen (ADVIL) 200 MG CAPS Take by mouth. Four a day      . Loratadine-Pseudoephedrine (CLARITIN-D 24 HOUR PO) Take by mouth.      . Melatonin 3 MG TABS Take by mouth.      . Multiple Vitamin (MULTIVITAMIN) tablet Take 1 tablet by mouth daily.        Marland Kitchen omeprazole (PRILOSEC) 20 MG capsule Take 1 capsule (20 mg total) by mouth daily.  30 capsule  3  . phenazopyridine (PYRIDIUM) 200 MG tablet Take 1 tablet (200 mg total) by mouth 3 (three) times daily as needed.  15 tablet  1  . [DISCONTINUED] ALPRAZolam (XANAX) 0.25 MG tablet TAKE 1 TABLET BY MOUTH DAILY AS NEEDED  30 tablet  0  . cefUROXime (CEFTIN) 500 MG tablet Take 1 tablet (500 mg total) by mouth 2 (two) times daily. For sinusitis  20 tablet  0  . chlorpheniramine-HYDROcodone (TUSSIONEX) 10-8 MG/5ML LQCR Take 5 mLs by mouth every 12 (twelve) hours as needed.  115 mL  0  . [DISCONTINUED] cetirizine (ZYRTEC) 10 MG tablet Take 10 mg by mouth daily.        . [DISCONTINUED] DiphenhydrAMINE HCl (BENADRYL ALLERGY PO) Take by mouth. Takes PRN for IC pain      . [DISCONTINUED] hydrocortisone (ANUSOL-HC) 25 MG suppository Place 1 suppository (25 mg total) rectally every 12 (twelve) hours.  12 suppository  1   No facility-administered encounter medications on file as of 05/17/2012.    EXAM:  BP 104/70  Pulse 94  Temp(Src) 98.2 F (36.8 C) (Oral)  Wt 148 lb (67.132 kg)  BMI 25.39 kg/m2  SpO2  98%  Body mass index is 25.39 kg/(m^2).  WDWN in NAD  quiet respirations; mildly congested   Non toxic . Doesn't feel well HEENT: Normocephalic ;atraumatic , Eyes;  PERRL, EOMs  Full, lids and conjunctiva clear,,Ears: no deformities, canals nl, TM landmarks normal, Nose: no deformity  mucoid discharge bilaterally maxillary frontal tenderness left maxilla is the worst.  Mouth : OP clear without lesion or edema . Drainage tracts seen Neck: Supple without adenopathy or masses or bruits no specific point tenderness points to the paracervical muscles but has no meningismus. Chest:  Clear to A&P without wheezes rales or rhonchi CV:  S1-S2 no gallops or murmurs peripheral perfusion is normal Skin :nl perfusion and no acute rashes   ASSESSMENT AND PLAN:  Discussed the following assessment and plan:  Sinusitis - 3-4 weeks of other measures agree to add antibiotic for resolution, S2 side effects of medicines we'll try Ceftin no history of cephalosporin allergy  Insomnia - Ongoing trazodone side effects sedation okay to use melatonin  ANXIETY DISORDER, GENERALIZED - Using Xanax as needed limiting risk-benefit.  Neck pain - no alarm features poss mechanical.  Review potential side effects of cough medicines et Karie Soda. -Patient advised to return or notify health care team  if symptoms worsen or persist or new concerns arise.  Patient Instructions  Treat for sinusitis. Neck pain.   Could be mechanical . Treat for sinusitis and see how things go.  Sometimes exercises or pT can help.  Ok to use  Melatonin  .  As we discussed  Expect improvement in the next 3-5 days    Caution with continuing long acting decongestants.        Neta Mends. Panosh M.D.

## 2012-05-17 NOTE — Patient Instructions (Addendum)
Treat for sinusitis. Neck pain.   Could be mechanical . Treat for sinusitis and see how things go.  Sometimes exercises or pT can help.  Ok to use  Melatonin  .  As we discussed  Expect improvement in the next 3-5 days    Caution with continuing long acting decongestants.

## 2012-08-14 ENCOUNTER — Ambulatory Visit (INDEPENDENT_AMBULATORY_CARE_PROVIDER_SITE_OTHER): Payer: 59 | Admitting: Internal Medicine

## 2012-08-14 ENCOUNTER — Encounter: Payer: Self-pay | Admitting: Internal Medicine

## 2012-08-14 VITALS — BP 108/80 | HR 79 | Temp 98.2°F | Wt 149.0 lb

## 2012-08-14 DIAGNOSIS — J329 Chronic sinusitis, unspecified: Secondary | ICD-10-CM

## 2012-08-14 DIAGNOSIS — J45909 Unspecified asthma, uncomplicated: Secondary | ICD-10-CM | POA: Insufficient documentation

## 2012-08-14 DIAGNOSIS — R079 Chest pain, unspecified: Secondary | ICD-10-CM

## 2012-08-14 DIAGNOSIS — J309 Allergic rhinitis, unspecified: Secondary | ICD-10-CM

## 2012-08-14 MED ORDER — ALBUTEROL SULFATE HFA 108 (90 BASE) MCG/ACT IN AERS: 2.0000 | INHALATION_SPRAY | Freq: Four times a day (QID) | RESPIRATORY_TRACT | Status: DC | PRN

## 2012-08-14 MED ORDER — AZITHROMYCIN 250 MG PO TABS: 250.0000 mg | ORAL_TABLET | ORAL | Status: DC

## 2012-08-14 MED ORDER — HYDROCOD POLST-CHLORPHEN POLST 10-8 MG/5ML PO LQCR: 5.0000 mL | Freq: Two times a day (BID) | ORAL | Status: DC | PRN

## 2012-08-14 MED ORDER — PREDNISONE 20 MG PO TABS: ORAL_TABLET | ORAL | Status: DC

## 2012-08-14 NOTE — Progress Notes (Signed)
Chief Complaint  Patient presents with  . Cough    Ongoing for 1 week.  Is getting worse.  Has taken Mucinex DM and Advil.  . Sore Throat  . Shortness of Breath    HPI: Patient comes in today for SDA for  new problem evaluation. Onset over a week   "sinus headache"  Back of head pressure from outside and then cough and throat sx . Heavy mucous   Using mucinex DM to break it up now hurts to breath took left over tussionex  Ran out couldn't sleep last night  chest discomfort.  Now coughing up yellow and gooky usually drier.   Pain when swallowing in chest and breathing in chest    Pain increasing over 2-3 days  No know fever.   ROS: See pertinent positives and negatives per HPI. No hx of asthma but allergy and not taking   Flonase uncertain if interacting sometimes    Nose bleed .  No past hx of asthma hx of sinus and allergy   Past Medical History  Diagnosis Date  . ANXIETY DISORDER, GENERALIZED 10/10/2006    hospitalized  about age 78 after child  wih panic episode  . Irritable bowel syndrome 10/10/2006  . ALLERGIC RHINITIS CAUSE UNSPECIFIED 04/09/2008  . 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 Erythromelalgia 04/22/2009  . Hx of endometriosis   . History of miscarriage     x 2   . ANA positive   . Anxiety     Family History  Problem Relation Age of Onset  . Lupus Brother   . Anxiety disorder Mother   . Colon cancer Paternal Grandfather   . Esophageal cancer Neg Hx   . Stomach cancer Neg Hx   . Rectal cancer Neg Hx     History   Social History  . Marital Status: Married    Spouse Name: N/A    Number of Children: N/A  . Years of Education: N/A   Social History Main Topics  . Smoking status: Never Smoker   . Smokeless tobacco: Never Used  . Alcohol Use: 1.5 oz/week    3 drink(s) per week     Comment: 1-2 drinks daily   . Drug Use: No  . Sexually Active: None   Other Topics Concern  . None   Social History Narrative   hh of 3    Wine at night max 1    Neg td     caffiene in am    Employed.    MSW  Married     Outpatient Encounter Prescriptions as of 08/14/2012  Medication Sig Dispense Refill  . ALPRAZolam (XANAX) 0.25 MG tablet TAKE 1 TABLET BY MOUTH DAILY AS NEEDED  30 tablet  1  . Ibuprofen (ADVIL) 200 MG CAPS Take by mouth. Four a day      . Melatonin 3 MG TABS Take by mouth.      . Multiple Vitamin (MULTIVITAMIN) tablet Take 1 tablet by mouth daily.        Marland Kitchen omeprazole (PRILOSEC) 20 MG capsule Take 1 capsule (20 mg total) by mouth daily.  30 capsule  3  . albuterol (PROAIR HFA) 108 (90 BASE) MCG/ACT inhaler Inhale 2 puffs into the lungs every 6 (six) hours as needed for wheezing (tight cough).  1 Inhaler  1  . azithromycin (ZITHROMAX Z-PAK) 250 MG tablet Take 1 tablet (250 mg total) by mouth as directed. Take 2 po first day, then 1 po qd  6 each  0  . chlorpheniramine-HYDROcodone (TUSSIONEX) 10-8 MG/5ML LQCR Take 5 mLs by mouth every 12 (twelve) hours as needed.  115 mL  0  . fluticasone (FLONASE) 50 MCG/ACT nasal spray 2 spray each nostril qd  16 g  3  . Loratadine-Pseudoephedrine (CLARITIN-D 24 HOUR PO) Take by mouth.      . phenazopyridine (PYRIDIUM) 200 MG tablet Take 1 tablet (200 mg total) by mouth 3 (three) times daily as needed.  15 tablet  1  . predniSONE (DELTASONE) 20 MG tablet Take 3 po qd for 2 days then 2 po qd for 3 days,or as directed  12 tablet  0  . [DISCONTINUED] cefUROXime (CEFTIN) 500 MG tablet Take 1 tablet (500 mg total) by mouth 2 (two) times daily. For sinusitis  20 tablet  0  . [DISCONTINUED] chlorpheniramine-HYDROcodone (TUSSIONEX) 10-8 MG/5ML LQCR Take 5 mLs by mouth every 12 (twelve) hours as needed.  115 mL  0   No facility-administered encounter medications on file as of 08/14/2012.    EXAM:  BP 108/80  Pulse 79  Temp(Src) 98.2 F (36.8 C) (Oral)  Wt 149 lb (67.586 kg)  BMI 25.56 kg/m2  SpO2 97%  Body mass index is 25.56 kg/(m^2).  GENERAL: vitals reviewed and listed above, alert, oriented, appears well  hydrated and in no acute distress tired  Uncomfortable  Deep cough tight wheezy   HEENT: atraumatic, conjunctiva  clear, no obvious abnormalities on inspection of external nose and ears nares  Yellow discharge   Face quite  tender bilaterally maxillary    OP : no lesion edema or exudate  Red no pnd.   NECK: no obvious masses on inspection palpation no adenopathy   LUNGS:  No retractions   Coarse breath sounds     ? Few wheezes  Tubular sounds colds chest in the middle. After nebulizer treatment increased breath sounds in able to produce some yellow phlegm. There is no blood CV: HRRR,  No g or rubs  no clubbing cyanosis or  peripheral edema nl cap refill  Abdomen soft without organomegaly guarding or rebound MS: moves all extremities without noticeable focal  abnormality   ASSESSMENT AND PLAN:  Discussed the following assessment and plan:  Asthmatic bronchitis  Chest pain  Sinusitis  ALLERGIC RHINITIS CAUSE UNSPECIFIED Overall she has probably underlying allergic component plus secondary infection in an asthmatic component. I suppose this could be viral but there are too many risk factors and per renal and nasal discharge also will treat for infection prednisone bronchodilator cough medicine she requests for comfort. Expectant management and followup close observation indicated.x-ray if chest symptoms are not improving after a few days. -Patient advised to return or notify health care team  if symptoms worsen or persist or new concerns arise.  Patient Instructions  begin antibiotic for sinusitis bronchitis.   And prednisone for the chest tightness.  Can add  Inhaler if needed.  expect improvement in the next 2-3 days   Although cough could last  Weeks but your pain should get better.    Restart the flonase after the prednisone  Or use nasacort   otc for allergy nasal control .        Neta Mends. Lucella Pommier M.D.

## 2012-08-14 NOTE — Patient Instructions (Signed)
begin antibiotic for sinusitis bronchitis.   And prednisone for the chest tightness.  Can add  Inhaler if needed.  expect improvement in the next 2-3 days   Although cough could last  Weeks but your pain should get better.    Restart the flonase after the prednisone  Or use nasacort   otc for allergy nasal control .

## 2012-09-24 ENCOUNTER — Telehealth: Payer: Self-pay | Admitting: Internal Medicine

## 2012-09-24 NOTE — Telephone Encounter (Signed)
Does not look like she has had a Twinrix.  What else will she need?

## 2012-09-24 NOTE — Telephone Encounter (Signed)
Pt and her husband will be taking a trip to [place] 01/24/13-12/03/12 and need to know what vaccines and medication they need prior to travel.

## 2012-09-26 NOTE — Telephone Encounter (Signed)
Patient returned by call.  Called her back and left message on home/cell.

## 2012-09-26 NOTE — Telephone Encounter (Signed)
Left message on cell for the pt to return my call. 

## 2012-09-26 NOTE — Telephone Encounter (Signed)
They should Get on the cdc .gov travel site to check advice for travelers  For the area where they will be.      Usually updated tdap and hep a   Other depending on  Where they will be . And then get back with [place].   She will prob need twin rix  Can begin this  At any time tdap if not done with in 8-10 years.

## 2012-10-01 NOTE — Telephone Encounter (Signed)
Unable to reach the pt personally.  Left all information on personally identified cell.

## 2012-11-08 ENCOUNTER — Encounter: Payer: Self-pay | Admitting: Internal Medicine

## 2012-11-21 ENCOUNTER — Other Ambulatory Visit: Payer: Self-pay | Admitting: Obstetrics and Gynecology

## 2012-11-21 DIAGNOSIS — N644 Mastodynia: Secondary | ICD-10-CM

## 2012-12-05 ENCOUNTER — Telehealth: Payer: Self-pay | Admitting: Internal Medicine

## 2012-12-05 NOTE — Telephone Encounter (Signed)
Pt called to request a one month supply of her ALPRAZolam (XANAX) 0.25 MG tablet, sent to [facility] on [address]. Please assist.

## 2012-12-05 NOTE — Telephone Encounter (Signed)
Last filled on 05/17/12 #30 with 1 additional refill She has no future appointment scheduled. Last seen on 08/14/12 for bronchitis. Please advise. Thanks!

## 2012-12-06 NOTE — Telephone Encounter (Signed)
Ok to refill x 1  

## 2012-12-07 ENCOUNTER — Other Ambulatory Visit: Payer: Self-pay | Admitting: Family Medicine

## 2012-12-07 MED ORDER — ALPRAZOLAM 0.25 MG PO TABS: ORAL_TABLET | ORAL | Status: DC

## 2012-12-07 NOTE — Telephone Encounter (Signed)
Called to the pharmacy and left on voicemail. 

## 2012-12-21 ENCOUNTER — Ambulatory Visit (INDEPENDENT_AMBULATORY_CARE_PROVIDER_SITE_OTHER): Payer: 59 | Admitting: Family Medicine

## 2012-12-21 DIAGNOSIS — Z23 Encounter for immunization: Secondary | ICD-10-CM

## 2013-01-03 ENCOUNTER — Ambulatory Visit (INDEPENDENT_AMBULATORY_CARE_PROVIDER_SITE_OTHER): Payer: 59 | Admitting: Internal Medicine

## 2013-01-03 ENCOUNTER — Encounter: Payer: Self-pay | Admitting: Internal Medicine

## 2013-01-03 VITALS — BP 128/74 | HR 80 | Temp 98.1°F | Wt 148.0 lb

## 2013-01-03 DIAGNOSIS — J309 Allergic rhinitis, unspecified: Secondary | ICD-10-CM

## 2013-01-03 DIAGNOSIS — R51 Headache: Secondary | ICD-10-CM

## 2013-01-03 DIAGNOSIS — J019 Acute sinusitis, unspecified: Secondary | ICD-10-CM

## 2013-01-03 MED ORDER — PREDNISONE 20 MG PO TABS: ORAL_TABLET | ORAL | Status: DC

## 2013-01-03 MED ORDER — CEFUROXIME AXETIL 250 MG PO TABS: 250.0000 mg | ORAL_TABLET | Freq: Two times a day (BID) | ORAL | Status: DC

## 2013-01-03 NOTE — Progress Notes (Signed)
Chief Complaint  Patient presents with  . Cough    Has a tingling sensation on top of her head.  States she has had it before.  However, this is the mose intense that it has ever been.  Marland Kitchen Headache    HPI: Patient comes in today for SDA for  new problem evaluation. Onset weeks of nasal congestion on sudafed and flonase husband sick with sinus and got shot and antibiotic is traveling in a week and then [place] . No fever headache similar as in past but more intense no vomiting vision changes  Using advil frequently.   ROS: See pertinent positives and negatives per HPI.minope cough no wheeze   Past Medical History  Diagnosis Date  . ANXIETY DISORDER, GENERALIZED 10/10/2006    hospitalized  about age 66 after child  wih panic episode  . Irritable bowel syndrome 10/10/2006  . ALLERGIC RHINITIS CAUSE UNSPECIFIED 04/09/2008  . 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 Erythromelalgia 04/22/2009  . Hx of endometriosis   . History of miscarriage     x 2   . ANA positive   . Anxiety     Family History  Problem Relation Age of Onset  . Lupus Brother   . Anxiety disorder Mother   . Colon cancer Paternal Grandfather   . Esophageal cancer Neg Hx   . Stomach cancer Neg Hx   . Rectal cancer Neg Hx     History   Social History  . Marital Status: Married    Spouse Name: N/A    Number of Children: N/A  . Years of Education: N/A   Social History Main Topics  . Smoking status: Never Smoker   . Smokeless tobacco: Never Used  . Alcohol Use: 1.5 oz/week    3 drink(s) per week     Comment: 1-2 drinks daily   . Drug Use: No  . Sexual Activity: None   Other Topics Concern  . None   Social History Narrative   hh of 3    Wine at night max 1    Neg td    caffiene in am    Employed.    MSW  Married     Outpatient Encounter Prescriptions as of 01/03/2013  Medication Sig Dispense Refill  . albuterol (PROAIR HFA) 108 (90 BASE) MCG/ACT inhaler Inhale 2 puffs into the lungs every 6 (six)  hours as needed for wheezing (tight cough).  1 Inhaler  1  . ALPRAZolam (XANAX) 0.25 MG tablet TAKE 1 TABLET BY MOUTH DAILY AS NEEDED  30 tablet  0  . fluticasone (FLONASE) 50 MCG/ACT nasal spray 2 spray each nostril qd  16 g  3  . Ibuprofen (ADVIL) 200 MG CAPS Take by mouth. Four a day      . Loratadine-Pseudoephedrine (CLARITIN-D 24 HOUR PO) Take by mouth.      . Melatonin 3 MG TABS Take by mouth.      . Multiple Vitamin (MULTIVITAMIN) tablet Take 1 tablet by mouth daily.        Marland Kitchen omeprazole (PRILOSEC) 20 MG capsule Take 1 capsule (20 mg total) by mouth daily.  30 capsule  3  . phenazopyridine (PYRIDIUM) 200 MG tablet Take 1 tablet (200 mg total) by mouth 3 (three) times daily as needed.  15 tablet  1  . cefUROXime (CEFTIN) 250 MG tablet Take 1 tablet (250 mg total) by mouth 2 (two) times daily. For sinusitis  20 tablet  0  . chlorpheniramine-HYDROcodone (TUSSIONEX) 10-8 MG/5ML  LQCR Take 5 mLs by mouth every 12 (twelve) hours as needed.  115 mL  0  . predniSONE (DELTASONE) 20 MG tablet Take 3 po qd for 2 days then 2 po qd for 3 days,or as directed  12 tablet  0  . [DISCONTINUED] azithromycin (ZITHROMAX Z-PAK) 250 MG tablet Take 1 tablet (250 mg total) by mouth as directed. Take 2 po first day, then 1 po qd  6 each  0  . [DISCONTINUED] predniSONE (DELTASONE) 20 MG tablet Take 3 po qd for 2 days then 2 po qd for 3 days,or as directed  12 tablet  0   No facility-administered encounter medications on file as of 01/03/2013.    EXAM:  BP 128/74  Pulse 80  Temp(Src) 98.1 F (36.7 C) (Oral)  Wt 148 lb (67.132 kg)  BMI 25.39 kg/m2  SpO2 98%  Body mass index is 25.39 kg/(m^2).  GENERAL: vitals reviewed and listed above, alert, oriented, appears well hydrated and in no acute distress congested non toxic   HEENT: atraumatic, conjunctiva  clear, no obvious abnormalities on inspection of external nose and ears tnms nl nares congested tender maxillary right more than left  OP : no lesion edema or  exudate   NECK: no obvious masses on inspection palpation no nodes LUNGS: clear to auscultation bilaterally, no wheezes, rales or rhonchi, good air movement CV: HRRR, no clubbing cyanosis or  peripheral edema nl cap refill MS: moves all extremities without noticeable focal  abnormality Neuro grossly intact face symmetrical  PSYCH: pleasant and cooperative, no obvious depression or anxiety  ASSESSMENT AND PLAN:  Discussed the following assessment and plan:  Acute sinusitis with symptoms greater than 10 days - on sudafed and flonase traveling soon  right sided tendernss   Headache(784.0) - atypical left somttingling hx of same no neuro sx otheriwse   ALLERGIC RHINITIS CAUSE UNSPECIFIED dont think risk of ceftin is significant but warned of potential .  -Patient advised to return or notify health care team  if symptoms worsen or persist or new concerns arise.  Patient Instructions  Antibiotic and pred for probably sinusitis and poss migraine . Contact us if not improving in3-5 days or problem with   Medications       Headache and Allergies The relationship between allergies and headaches is unclear. Many people with allergic or infectious nasal problems also have headaches (migraines or sinus headaches). However, sometimes allergies can cause pressure that feels like a headache, and sometimes headaches can cause allergy-like symptoms. It is not always clear whether your symptoms are caused by allergies or by a headache. CAUSES   Migraine: The cause of a migraine is not always known.  Sinus Headache: The cause of a sinus headache may be a sinus infection. Other conditions that may be related to sinus headaches include:  Hay fever (allergic rhinitis).  Deviation of the nasal septum.  Swelling or clogging of the nasal passages. SYMPTOMS  Migraine headache symptoms (which often last 4 to 72 hours) include:  Intense, throbbing pain on one or both sides of the  head.  Nausea.  Vomiting.  Being extra sensitive to light.  Being extra sensitive to sound.  Nervous system reactions that appear similar to an allergic reaction:  Stuffy nose.  Runny nose.  Tearing. Sinus headaches are felt as facial pain or pressure.  DIAGNOSIS  Because there is some overlap in symptoms, sinus and migraine headaches are often misdiagnosed. For example, a person with migraines may also feel facial  pressure. Likewise, many people with hay fever may get migraine headaches rather than sinus headaches. These migraines can be triggered by the histamine release during an allergic reaction. An antihistamine medicine can eliminate this pain. There are standard criteria that help clarify the difference between these headaches and related allergy or allergy-like symptoms. Your caregiver can use these criteria to determine the proper diagnosis and provide you the best care. TREATMENT  Migraine medicine may help people who have persistent migraine headaches even though their hay fever is controlled. For some people, anti-inflammatory treatments do not work to relieve migraines. Medicines called triptans (such as sumatriptan) can be helpful for those people. Document Released: 06/04/2003 Document Revised: 06/06/2011 Document Reviewed: 06/26/2009 [facility] Patient Information 2014 [place], [place]. Sinusitis Sinusitis is redness, soreness, and swelling (inflammation) of the paranasal sinuses. Paranasal sinuses are air pockets within the bones of your face (beneath the eyes, the middle of the forehead, or above the eyes). In healthy paranasal sinuses, mucus is able to drain out, and air is able to circulate through them by way of your nose. However, when your paranasal sinuses are inflamed, mucus and air can become trapped. This can allow bacteria and other germs to grow and cause infection. Sinusitis can develop quickly and last only a short time (acute) or continue over a long period  (chronic). Sinusitis that lasts for more than 12 weeks is considered chronic.  CAUSES  Causes of sinusitis include:  Allergies.  Structural abnormalities, such as displacement of the cartilage that separates your nostrils (deviated septum), which can decrease the air flow through your nose and sinuses and affect sinus drainage.  Functional abnormalities, such as when the small hairs (cilia) that line your sinuses and help remove mucus do not work properly or are not present. SYMPTOMS  Symptoms of acute and chronic sinusitis are the same. The primary symptoms are pain and pressure around the affected sinuses. Other symptoms include:  Upper toothache.  Earache.  Headache.  Bad breath.  Decreased sense of smell and taste.  A cough, which worsens when you are lying flat.  Fatigue.  Fever.  Thick drainage from your nose, which often is green and may contain pus (purulent).  Swelling and warmth over the affected sinuses. DIAGNOSIS  Your caregiver will perform a physical exam. During the exam, your caregiver may:  Look in your nose for signs of abnormal growths in your nostrils (nasal polyps).  Tap over the affected sinus to check for signs of infection.  View the inside of your sinuses (endoscopy) with a special imaging device with a light attached (endoscope), which is inserted into your sinuses. If your caregiver suspects that you have chronic sinusitis, one or more of the following tests may be recommended:  Allergy tests.  Nasal culture A sample of mucus is taken from your nose and sent to a lab and screened for bacteria.  Nasal cytology A sample of mucus is taken from your nose and examined by your caregiver to determine if your sinusitis is related to an allergy. TREATMENT  Most cases of acute sinusitis are related to a viral infection and will resolve on their own within 10 days. Sometimes medicines are prescribed to help relieve symptoms (pain medicine, decongestants,  nasal steroid sprays, or saline sprays).  However, for sinusitis related to a bacterial infection, your caregiver will prescribe antibiotic medicines. These are medicines that will help kill the bacteria causing the infection.  Rarely, sinusitis is caused by a fungal infection. In theses cases, your  caregiver will prescribe antifungal medicine. For some cases of chronic sinusitis, surgery is needed. Generally, these are cases in which sinusitis recurs more than 3 times per year, despite other treatments. HOME CARE INSTRUCTIONS   Drink plenty of water. Water helps thin the mucus so your sinuses can drain more easily.  Use a humidifier.  Inhale steam 3 to 4 times a day (for example, sit in the bathroom with the shower running).  Apply a warm, moist washcloth to your face 3 to 4 times a day, or as directed by your caregiver.  Use saline nasal sprays to help moisten and clean your sinuses.  Take over-the-counter or prescription medicines for pain, discomfort, or fever only as directed by your caregiver. SEEK IMMEDIATE MEDICAL CARE IF:  You have increasing pain or severe headaches.  You have nausea, vomiting, or drowsiness.  You have swelling around your face.  You have vision problems.  You have a stiff neck.  You have difficulty breathing. MAKE SURE YOU:   Understand these instructions.  Will watch your condition.  Will get help right away if you are not doing well or get worse. Document Released: 03/14/2005 Document Revised: 06/06/2011 Document Reviewed: 03/29/2011 [facility] Patient Information 2014 [place], [place].      Neta Mends. Panosh M.D.

## 2013-01-03 NOTE — Patient Instructions (Signed)
Antibiotic and pred for probably sinusitis and poss migraine . Contact us if not improving in3-5 days or problem with   Medications       Headache and Allergies The relationship between allergies and headaches is unclear. Many people with allergic or infectious nasal problems also have headaches (migraines or sinus headaches). However, sometimes allergies can cause pressure that feels like a headache, and sometimes headaches can cause allergy-like symptoms. It is not always clear whether your symptoms are caused by allergies or by a headache. CAUSES   Migraine: The cause of a migraine is not always known.  Sinus Headache: The cause of a sinus headache may be a sinus infection. Other conditions that may be related to sinus headaches include:  Hay fever (allergic rhinitis).  Deviation of the nasal septum.  Swelling or clogging of the nasal passages. SYMPTOMS  Migraine headache symptoms (which often last 4 to 72 hours) include:  Intense, throbbing pain on one or both sides of the head.  Nausea.  Vomiting.  Being extra sensitive to light.  Being extra sensitive to sound.  Nervous system reactions that appear similar to an allergic reaction:  Stuffy nose.  Runny nose.  Tearing. Sinus headaches are felt as facial pain or pressure.  DIAGNOSIS  Because there is some overlap in symptoms, sinus and migraine headaches are often misdiagnosed. For example, a person with migraines may also feel facial pressure. Likewise, many people with hay fever may get migraine headaches rather than sinus headaches. These migraines can be triggered by the histamine release during an allergic reaction. An antihistamine medicine can eliminate this pain. There are standard criteria that help clarify the difference between these headaches and related allergy or allergy-like symptoms. Your caregiver can use these criteria to determine the proper diagnosis and provide you the best care. TREATMENT    Migraine medicine may help people who have persistent migraine headaches even though their hay fever is controlled. For some people, anti-inflammatory treatments do not work to relieve migraines. Medicines called triptans (such as sumatriptan) can be helpful for those people. Document Released: 06/04/2003 Document Revised: 06/06/2011 Document Reviewed: 06/26/2009 [facility] Patient Information 2014 [place], [place]. Sinusitis Sinusitis is redness, soreness, and swelling (inflammation) of the paranasal sinuses. Paranasal sinuses are air pockets within the bones of your face (beneath the eyes, the middle of the forehead, or above the eyes). In healthy paranasal sinuses, mucus is able to drain out, and air is able to circulate through them by way of your nose. However, when your paranasal sinuses are inflamed, mucus and air can become trapped. This can allow bacteria and other germs to grow and cause infection. Sinusitis can develop quickly and last only a short time (acute) or continue over a long period (chronic). Sinusitis that lasts for more than 12 weeks is considered chronic.  CAUSES  Causes of sinusitis include:  Allergies.  Structural abnormalities, such as displacement of the cartilage that separates your nostrils (deviated septum), which can decrease the air flow through your nose and sinuses and affect sinus drainage.  Functional abnormalities, such as when the small hairs (cilia) that line your sinuses and help remove mucus do not work properly or are not present. SYMPTOMS  Symptoms of acute and chronic sinusitis are the same. The primary symptoms are pain and pressure around the affected sinuses. Other symptoms include:  Upper toothache.  Earache.  Headache.  Bad breath.  Decreased sense of smell and taste.  A cough, which worsens when you are lying flat.  Fatigue.  Fever.  Thick drainage from your nose, which often is green and may contain pus (purulent).  Swelling and  warmth over the affected sinuses. DIAGNOSIS  Your caregiver will perform a physical exam. During the exam, your caregiver may:  Look in your nose for signs of abnormal growths in your nostrils (nasal polyps).  Tap over the affected sinus to check for signs of infection.  View the inside of your sinuses (endoscopy) with a special imaging device with a light attached (endoscope), which is inserted into your sinuses. If your caregiver suspects that you have chronic sinusitis, one or more of the following tests may be recommended:  Allergy tests.  Nasal culture A sample of mucus is taken from your nose and sent to a lab and screened for bacteria.  Nasal cytology A sample of mucus is taken from your nose and examined by your caregiver to determine if your sinusitis is related to an allergy. TREATMENT  Most cases of acute sinusitis are related to a viral infection and will resolve on their own within 10 days. Sometimes medicines are prescribed to help relieve symptoms (pain medicine, decongestants, nasal steroid sprays, or saline sprays).  However, for sinusitis related to a bacterial infection, your caregiver will prescribe antibiotic medicines. These are medicines that will help kill the bacteria causing the infection.  Rarely, sinusitis is caused by a fungal infection. In theses cases, your caregiver will prescribe antifungal medicine. For some cases of chronic sinusitis, surgery is needed. Generally, these are cases in which sinusitis recurs more than 3 times per year, despite other treatments. HOME CARE INSTRUCTIONS   Drink plenty of water. Water helps thin the mucus so your sinuses can drain more easily.  Use a humidifier.  Inhale steam 3 to 4 times a day (for example, sit in the bathroom with the shower running).  Apply a warm, moist washcloth to your face 3 to 4 times a day, or as directed by your caregiver.  Use saline nasal sprays to help moisten and clean your sinuses.  Take  over-the-counter or prescription medicines for pain, discomfort, or fever only as directed by your caregiver. SEEK IMMEDIATE MEDICAL CARE IF:  You have increasing pain or severe headaches.  You have nausea, vomiting, or drowsiness.  You have swelling around your face.  You have vision problems.  You have a stiff neck.  You have difficulty breathing. MAKE SURE YOU:   Understand these instructions.  Will watch your condition.  Will get help right away if you are not doing well or get worse. Document Released: 03/14/2005 Document Revised: 06/06/2011 Document Reviewed: 03/29/2011 [facility] Patient Information 2014 [place], [place].

## 2013-01-04 ENCOUNTER — Telehealth: Payer: Self-pay | Admitting: Internal Medicine

## 2013-01-04 MED ORDER — AZITHROMYCIN 250 MG PO TABS: ORAL_TABLET | ORAL | Status: DC

## 2013-01-04 NOTE — Telephone Encounter (Signed)
Please document what  Specific reaction so we can put it on her allergy  Adverse medication list.  Call in z pack.

## 2013-01-04 NOTE — Telephone Encounter (Signed)
Patient notified rx sent to the pharmacy.  Ceftin added to allergies.

## 2013-01-04 NOTE — Telephone Encounter (Signed)
Pt states the antibiotic you rx yesterday IS the one she has a reaction to.  Needs yo to call in [facility] to [facility]/ [address]

## 2013-01-10 ENCOUNTER — Other Ambulatory Visit: Payer: Self-pay | Admitting: Internal Medicine

## 2013-01-14 ENCOUNTER — Other Ambulatory Visit: Payer: Self-pay | Admitting: Family Medicine

## 2013-01-14 MED ORDER — PHENAZOPYRIDINE HCL 200 MG PO TABS: 200.0000 mg | ORAL_TABLET | Freq: Three times a day (TID) | ORAL | Status: DC | PRN

## 2013-02-11 ENCOUNTER — Telehealth: Payer: Self-pay | Admitting: Internal Medicine

## 2013-02-11 NOTE — Telephone Encounter (Signed)
Pt needs refill on xanax call into [facility]

## 2013-02-11 NOTE — Telephone Encounter (Signed)
Last filled on 12/07/12 #30 with 0 additional refills Seen in acute visit on 01/03/13 No future appt. Please advise Thanks!

## 2013-02-12 NOTE — Telephone Encounter (Signed)
Ok x 1

## 2013-02-13 ENCOUNTER — Other Ambulatory Visit: Payer: Self-pay | Admitting: Family Medicine

## 2013-02-13 MED ORDER — ALPRAZOLAM 0.25 MG PO TABS: ORAL_TABLET | ORAL | Status: DC

## 2013-02-13 NOTE — Telephone Encounter (Signed)
Called to the pharmacy and left on voicemail. 

## 2013-02-15 ENCOUNTER — Encounter: Payer: Self-pay | Admitting: Neurology

## 2013-02-18 ENCOUNTER — Encounter: Payer: Self-pay | Admitting: Neurology

## 2013-02-18 ENCOUNTER — Ambulatory Visit (INDEPENDENT_AMBULATORY_CARE_PROVIDER_SITE_OTHER): Payer: 59 | Admitting: Neurology

## 2013-02-18 VITALS — BP 95/70 | HR 68 | Temp 97.4°F | Ht 65.75 in | Wt 153.0 lb

## 2013-02-18 DIAGNOSIS — R202 Paresthesia of skin: Secondary | ICD-10-CM

## 2013-02-18 DIAGNOSIS — R209 Unspecified disturbances of skin sensation: Secondary | ICD-10-CM

## 2013-02-18 MED ORDER — GABAPENTIN 100 MG PO CAPS: 100.0000 mg | ORAL_CAPSULE | Freq: Three times a day (TID) | ORAL | Status: DC

## 2013-02-18 NOTE — Progress Notes (Signed)
[facility]    Provider:  Dr Hosie Poisson Referring Provider: Madelin Headings, MD Primary Care Physician:  Lorretta Harp, MD  CC:  Facial paresthesias  HPI:  Erica Saunders is a 42 y.o. female here as a referral from Dr. Fabian Sharp for facial paresthesias  Ongoing for a couple of years. Start in the occipital region, tingling, zapping pain, predominantly in occipital region on the left and on the apex of the head. Can come and go. Occurs infrequently, can come in clusters. No associated N/V, no photo or phonophobia, no visual changes. Typically will last few minutes, but can last all day. Has brief transient episodes of paresthesias, predominantly on the left side.   No history of migraines, no family history of migraines. Takes 4 to 6 advil a day for the hip and headache pain. She also notes concern over difficulty with short term memory.    Does have sinus problems, a lot of nasal congestion.   Has had symptoms of hands and feet get purple/cold and red/hot. Scheduled to see a rheumatologist at [facility] in the future. Has family history of Lupus/autoimmune conditions.     Review of Systems: Out of a complete 14 system review, the patient complains of only the following symptoms, and all other reviewed systems are negative. Positive for memory loss headache numbness dizziness sleepiness increased thirst joint pain cramps blood in stool trouble swallowing urinary problems runny nose consents to be frequent infections fatigue  History   Social History  . Marital Status: Married    Spouse Name: Loraine Leriche    Number of Children: 1  . Years of Education: 1   Occupational History  .     Social History Main Topics  . Smoking status: Never Smoker   . Smokeless tobacco: Never Used  . Alcohol Use: 1.5 oz/week    3 drink(s) per week     Comment: 1-2 drinks daily   . Drug Use: No  . Sexual Activity: Not on file   Other Topics Concern  . Not on file   Social History  Narrative   Patient is married Government social research officer)   Wine at night max 1    Neg td    caffiene in am    Employed.    MSW    Patient has one child.   Patient works full-time, Presenter, broadcasting.   Patient has her Masters.    Family History  Problem Relation Age of Onset  . Lupus Brother   . Anxiety disorder Mother   . Colon cancer Paternal Grandfather   . Esophageal cancer Neg Hx   . Stomach cancer Neg Hx   . Rectal cancer Neg Hx   . Memory loss Mother   . Other Father     polio    Past Medical History  Diagnosis Date  . ANXIETY DISORDER, GENERALIZED 10/10/2006    hospitalized  about age 54 after child  wih panic episode  . Irritable bowel syndrome 10/10/2006  . ALLERGIC RHINITIS CAUSE UNSPECIFIED 04/09/2008  . 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 Erythromelalgia 04/22/2009  . Hx of endometriosis   . History of miscarriage     x 2   . ANA positive   . Anxiety     Past Surgical History  Procedure Laterality Date  . Abdominal hysterectomy    . Appendectomy    . Cholecystectomy    . Tonsillectomy and adenoidectomy    . Laparoscopy      endometriosis  . Dilation and curettage  of uterus    . Cesarean section      Current Outpatient Prescriptions  Medication Sig Dispense Refill  . albuterol (PROAIR HFA) 108 (90 BASE) MCG/ACT inhaler Inhale 2 puffs into the lungs every 6 (six) hours as needed for wheezing (tight cough).  1 Inhaler  1  . ALPRAZolam (XANAX) 0.25 MG tablet TAKE 1 TABLET BY MOUTH DAILY AS NEEDED  30 tablet  0  . fluticasone (FLONASE) 50 MCG/ACT nasal spray 2 spray each nostril qd  16 g  3  . Ibuprofen (ADVIL) 200 MG CAPS Take by mouth. Four a day      . Loratadine-Pseudoephedrine (CLARITIN-D 24 HOUR PO) Take by mouth.      . Melatonin 3 MG TABS Take by mouth.      . Multiple Vitamin (MULTIVITAMIN) tablet Take 1 tablet by mouth daily.        Marland Kitchen omeprazole (PRILOSEC) 20 MG capsule Take 1 capsule (20 mg total) by mouth daily.  30 capsule  3  . phenazopyridine (PYRIDIUM) 200  MG tablet Take 1 tablet (200 mg total) by mouth 3 (three) times daily as needed.  15 tablet  0   No current facility-administered medications for this visit.    Allergies as of 02/18/2013 - Review Complete 02/18/2013  Allergen Reaction Noted  . Celexa [citalopram hydrobromide]  02/15/2012  . Trazodone and nefazodone Other (See Comments) 05/17/2012  . Ceftin [cefuroxime] Nausea Only 01/04/2013  . Penicillins  03/19/2007  . Prozac [fluoxetine hcl] Hives 02/15/2012    Vitals: BP 95/70  Pulse 68  Temp(Src) 97.4 F (36.3 C)  Ht 5' 5.75" (1.67 m)  Wt 153 lb (69.4 kg)  BMI 24.88 kg/m2 Last Weight:  Wt Readings from Last 1 Encounters:  02/18/13 153 lb (69.4 kg)   Last Height:   Ht Readings from Last 1 Encounters:  02/18/13 5' 5.75" (1.67 m)     Physical exam: Exam: Gen: NAD, conversant Eyes: anicteric sclerae, moist conjunctivae HENT: Atraumatic, oropharynx clear Neck: Trachea midline; supple,  Lungs: CTA, no wheezing, rales, rhonic                          CV: RRR, no MRG Abdomen: Soft, non-tender;  Extremities: No peripheral edema  Skin: Normal temperature, no rash,  Psych: Appropriate affect, pleasant  Neuro: MS: AA&Ox3, appropriately interactive, normal affect   Speech: fluent w/o paraphasic error  Memory: good recent and remote recall  CN: PERRL, EOMI no nystagmus, no ptosis, sensation intact to LT V1-V3 bilat, face symmetric, no weakness, hearing grossly intact, palate elevates symmetrically, shoulder shrug 5/5 bilat,  tongue protrudes midline, no fasiculations noted.  Motor: normal bulk and tone Strength: 5/5  In all extremities  Coord: rapid alternating and point-to-point (FNF, HTS) movements intact.  Reflexes: symmetrical, bilat downgoing toes  Sens: LT intact in all extremities  Gait: posture, stance, stride and arm-swing normal. Tandem gait intact. Able to walk on heels and toes. Romberg absent.   Assessment:  After physical and neurologic  examination, review of laboratory studies, imaging, neurophysiology testing and pre-existing records, assessment will be reviewed on the problem list.  Plan:  Treatment plan and additional workup will be reviewed under Problem List.  1)facial; pain  42 year old woman presenting for initial evaluation of intermittent episodes of facial paresthesias predominantly involving the left occipital nerve region. Patient also notes in episodes of paresthesias and weakness involving the left upper and lower extremity. She has a history of bladder  problems, her toes turning blue and cold. Also has a strong family history of autoimmune conditions. She is scheduled to followup with rheumatology in February. Unclear etiology of her symptoms. Based on age, family history and autoimmune history will need to rule out multiple sclerosis. Would also consider occipital neuralgia vs Trigeminal autonomic cephalgia and or medication overuse headache as patient is using frequent dosing of ibuprofen daily. Will check brain MRI, try patient on Neurontin 100 mg 3 times a day, taper down ibuprofen. Patient scheduled to followup with rheumatology, will followup in our office after pathology evaluation. Will check MOCA at next visit.

## 2013-02-18 NOTE — Patient Instructions (Signed)
Overall you are doing fairly well but I do want to suggest a few things today:   Remember to drink plenty of fluid, eat healthy meals and do not skip any meals. Try to eat protein with a every meal and eat a healthy snack such as fruit or nuts in between meals. Try to keep a regular sleep-wake schedule and try to exercise daily, particularly in the form of walking, 20-30 minutes a day, if you can.   As far as your medications are concerned, we started you on a medication called Gabapentin 100mg . Please take one capsule 3 times a day.   As far as diagnostic testing:  1)We will check a MRI of your brain  I would like to see you back in March, sooner if we need to. Please call us with any interim questions, concerns, problems, updates or refill requests.   My clinical assistant and will answer any of your questions and relay your messages to me and also relay most of my messages to you.   Our phone number is [phone]. We also have an after hours call service for urgent matters and there is a physician on-call for urgent questions. For any emergencies you know to call 911 or go to the nearest emergency room

## 2013-03-01 ENCOUNTER — Other Ambulatory Visit: Payer: Self-pay | Admitting: Neurology

## 2013-03-01 DIAGNOSIS — R51 Headache: Secondary | ICD-10-CM

## 2013-03-06 ENCOUNTER — Ambulatory Visit (INDEPENDENT_AMBULATORY_CARE_PROVIDER_SITE_OTHER): Payer: 59

## 2013-03-06 ENCOUNTER — Encounter: Payer: Self-pay | Admitting: *Deleted

## 2013-03-06 DIAGNOSIS — R202 Paresthesia of skin: Secondary | ICD-10-CM

## 2013-03-06 DIAGNOSIS — R51 Headache: Secondary | ICD-10-CM

## 2013-03-06 DIAGNOSIS — R209 Unspecified disturbances of skin sensation: Secondary | ICD-10-CM

## 2013-03-06 MED ORDER — GADOPENTETATE DIMEGLUMINE 469.01 MG/ML IV SOLN: 15.0000 mL | Freq: Once | INTRAVENOUS | Status: AC | PRN

## 2013-03-07 ENCOUNTER — Ambulatory Visit (INDEPENDENT_AMBULATORY_CARE_PROVIDER_SITE_OTHER): Payer: 59 | Admitting: Internal Medicine

## 2013-03-07 ENCOUNTER — Encounter: Payer: Self-pay | Admitting: Internal Medicine

## 2013-03-07 VITALS — BP 120/80 | HR 72 | Temp 98.0°F | Wt 150.0 lb

## 2013-03-07 DIAGNOSIS — R519 Headache, unspecified: Secondary | ICD-10-CM

## 2013-03-07 DIAGNOSIS — R51 Headache: Secondary | ICD-10-CM

## 2013-03-07 DIAGNOSIS — F411 Generalized anxiety disorder: Secondary | ICD-10-CM

## 2013-03-07 DIAGNOSIS — F410 Panic disorder [episodic paroxysmal anxiety] without agoraphobia: Secondary | ICD-10-CM

## 2013-03-07 MED ORDER — ALPRAZOLAM 0.5 MG PO TABS: ORAL_TABLET | ORAL | Status: DC

## 2013-03-07 MED ORDER — ESCITALOPRAM OXALATE 10 MG PO TABS: 5.0000 mg | ORAL_TABLET | Freq: Every day | ORAL | Status: DC

## 2013-03-07 NOTE — Assessment & Plan Note (Signed)
Trial of low-dose Lexapro in addition to her alprazolam consider behavioral health education consult about other options follow up in 3-4 weeks otherwise.

## 2013-03-07 NOTE — Progress Notes (Signed)
Chief Complaint  Patient presents with  . Follow-up    Would like to discuss her anxiety.    HPI: Talk about anxiety.  Has had problems ever since she was a teenager reviewing her history. His ramping up again during holiday season  Xanax  Using up med. More frequently than baseline. Needs advise and other options.  Hx panic  In HS. had evaluation with physical symptoms including nausea vomiting.   When in college had warning symptoms  Worse in am.  Sick [place] with anxiety. No meds.   Grad school : then irritable bowel attacks  In car and overwhelmed.  Medical evaluation Sugars were ok  osu   Dx panic attacks     Paxil in 90s    Worked for a year  but had bad dreams  .  Sleep off and thus stopped and used emergency xanax.  As needed  For 5 years .  Then again  Got celexa sort of work and then got facial tic. Stayed on xanax.   Dr Vincente Poli  Had helped her through her preg and fertility issues also   prozac with hives.  Has been on  Xanax 0.5 mg at times.    Has had ed visit from anxiety .   In the past when visiting relatives or travel.   ROS: See pertinent positives and negatives per HPI. History of headaches and has been evaluated trying to wean off the ibuprofen was given gabapentin but hasn't taken it yet has had questions about it. Mom has anxiety not panic gthat she knows of  Neg tad  Sud etc  Past Medical History  Diagnosis Date  . ANXIETY DISORDER, GENERALIZED 10/10/2006    hospitalized  about age 18 after child  wih panic episode  . Irritable bowel syndrome 10/10/2006  . ALLERGIC RHINITIS CAUSE UNSPECIFIED 04/09/2008  . 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 Erythromelalgia 04/22/2009  . Hx of endometriosis   . History of miscarriage     x 2   . ANA positive   . Anxiety     Family History  Problem Relation Age of Onset  . Lupus Brother   . Anxiety disorder Mother   . Colon cancer Paternal Grandfather   . Esophageal cancer Neg Hx   . Stomach cancer Neg Hx   . Rectal  cancer Neg Hx   . Memory loss Mother   . Other Father     polio    History   Social History  . Marital Status: Married    Spouse Name: Loraine Leriche    Number of Children: 1  . Years of Education: 69   Occupational History  .     Social History Main Topics  . Smoking status: Never Smoker   . Smokeless tobacco: Never Used  . Alcohol Use: 1.5 oz/week    3 drink(s) per week     Comment: 1-2 drinks daily   . Drug Use: No  . Sexual Activity: None   Other Topics Concern  . None   Social History Narrative   Patient is married Government social research officer)   Wine at night max 1    Neg td    caffiene in am    Employed.    MSW    Patient has one child.   Patient works full-time, Presenter, broadcasting.   Patient has her Masters.    Outpatient Encounter Prescriptions as of 03/07/2013  Medication Sig  . fluticasone (FLONASE) 50 MCG/ACT nasal spray 2 spray each nostril qd  .  Ibuprofen (ADVIL) 200 MG CAPS Take by mouth. Four a day  . Loratadine-Pseudoephedrine (CLARITIN-D 24 HOUR PO) Take by mouth.  . Melatonin 3 MG TABS Take by mouth.  . Multiple Vitamin (MULTIVITAMIN) tablet Take 1 tablet by mouth daily.    Marland Kitchen omeprazole (PRILOSEC) 20 MG capsule Take 1 capsule (20 mg total) by mouth daily.  . phenazopyridine (PYRIDIUM) 200 MG tablet Take 1 tablet (200 mg total) by mouth 3 (three) times daily as needed.  . [DISCONTINUED] ALPRAZolam (XANAX) 0.25 MG tablet TAKE 1 TABLET BY MOUTH DAILY AS NEEDED  . ALPRAZolam (XANAX) 0.5 MG tablet 1/2 to 1 po if needed for anxiety up to tid  . escitalopram (LEXAPRO) 10 MG tablet Take 0.5 tablets (5 mg total) by mouth daily. Can increase to 10 mg per day.  . gabapentin (NEURONTIN) 100 MG capsule Take 1 capsule (100 mg total) by mouth 3 (three) times daily.  . [DISCONTINUED] albuterol (PROAIR HFA) 108 (90 BASE) MCG/ACT inhaler Inhale 2 puffs into the lungs every 6 (six) hours as needed for wheezing (tight cough).  . [DISCONTINUED] ALPRAZolam (XANAX) 0.5 MG tablet 1/2 to 1 po if needed  for anxiety up to tid    EXAM:  BP 120/80  Pulse 72  Temp(Src) 98 F (36.7 C) (Oral)  Wt 150 lb (68.04 kg)  SpO2 98%  Body mass index is 24.4 kg/(m^2).  GENERAL: vitals reviewed and listed above, alert, oriented, appears well hydrated and in no acute distress  PSYCH: pleasant and cooperative, no obvious depression or anxiety Oriented x 3 and no noted deficits in memory, attention, and speech.  ASSESSMENT AND PLAN:  Discussed the following assessment and plan:  Panic disorder - currentl not controlled has had se of prev meds   ANXIETY DISORDER, GENERALIZED  Headache disorder Discussed other options for trials of controller medicines perhaps seeing behavioral health specialist psychiatrist further medication options. Given 0.5 of Xanax to use as needed.  Consider BuSpar or bring tell ex if having side effects of current problem.  In regard to her headaches and weaning off the ibuprofen discussed gabapentin she will take it but after she starts the Lexapro to avoid side effect confusion. -Patient advised to return or notify health care team  if symptoms worsen or persist or new concerns arise.  Patient Instructions  Try  low dose lexapro  Get medication consult  Dr Linna Caprice. ROV in 3-4 weeks  Or as needed .    Anxiety and Panic Attacks Your caregiver has informed you that you are having an anxiety or panic attack. There may be many forms of this. Most of the time these attacks come suddenly and without warning. They come at any time of day, including periods of sleep, and at any time of life. They may be strong and unexplained. Although panic attacks are very scary, they are physically harmless. Sometimes the cause of your anxiety is not known. Anxiety is a protective mechanism of the body in its fight or flight mechanism. Most of these perceived danger situations are actually nonphysical situations (such as anxiety over losing a job). CAUSES  The causes of an anxiety or panic  attack are many. Panic attacks may occur in otherwise healthy people given a certain set of circumstances. There may be a genetic cause for panic attacks. Some medications may also have anxiety as a side effect. SYMPTOMS  Some of the most common feelings are:  Intense terror.  Dizziness, feeling faint.  Hot and cold flashes.  Fear  of going crazy.  Feelings that nothing is real.  Sweating.  Shaking.  Chest pain or a fast heartbeat (palpitations).  Smothering, choking sensations.  Feelings of impending doom and that death is near.  Tingling of extremities, this may be from over-breathing.  Altered reality (derealization).  Being detached from yourself (depersonalization). Several symptoms can be present to make up anxiety or panic attacks. DIAGNOSIS  The evaluation by your caregiver will depend on the type of symptoms you are experiencing. The diagnosis of anxiety or panic attack is made when no physical illness can be determined to be a cause of the symptoms. TREATMENT  Treatment to prevent anxiety and panic attacks may include:  Avoidance of circumstances that cause anxiety.  Reassurance and relaxation.  Regular exercise.  Relaxation therapies, such as yoga.  Psychotherapy with a psychiatrist or therapist.  Avoidance of caffeine, alcohol and illegal drugs.  Prescribed medication. SEEK IMMEDIATE MEDICAL CARE IF:   You experience panic attack symptoms that are different than your usual symptoms.  You have any worsening or concerning symptoms. Document Released: 03/14/2005 Document Revised: 06/06/2011 Document Reviewed: 10/26/2012 [facility] Patient Information 2014 [place], [place].       Total visit > 50% spent counseling and coordinating care     Dexter K. Nykira Reddix M.D.  Pre visit review using our clinic review tool, if applicable. No additional management support is needed unless otherwise documented below in the visit note.

## 2013-03-07 NOTE — Patient Instructions (Signed)
Try  low dose lexapro  Get medication consult  Dr Linna Caprice. ROV in 3-4 weeks  Or as needed .    Anxiety and Panic Attacks Your caregiver has informed you that you are having an anxiety or panic attack. There may be many forms of this. Most of the time these attacks come suddenly and without warning. They come at any time of day, including periods of sleep, and at any time of life. They may be strong and unexplained. Although panic attacks are very scary, they are physically harmless. Sometimes the cause of your anxiety is not known. Anxiety is a protective mechanism of the body in its fight or flight mechanism. Most of these perceived danger situations are actually nonphysical situations (such as anxiety over losing a job). CAUSES  The causes of an anxiety or panic attack are many. Panic attacks may occur in otherwise healthy people given a certain set of circumstances. There may be a genetic cause for panic attacks. Some medications may also have anxiety as a side effect. SYMPTOMS  Some of the most common feelings are:  Intense terror.  Dizziness, feeling faint.  Hot and cold flashes.  Fear of going crazy.  Feelings that nothing is real.  Sweating.  Shaking.  Chest pain or a fast heartbeat (palpitations).  Smothering, choking sensations.  Feelings of impending doom and that death is near.  Tingling of extremities, this may be from over-breathing.  Altered reality (derealization).  Being detached from yourself (depersonalization). Several symptoms can be present to make up anxiety or panic attacks. DIAGNOSIS  The evaluation by your caregiver will depend on the type of symptoms you are experiencing. The diagnosis of anxiety or panic attack is made when no physical illness can be determined to be a cause of the symptoms. TREATMENT  Treatment to prevent anxiety and panic attacks may include:  Avoidance of circumstances that cause anxiety.  Reassurance and  relaxation.  Regular exercise.  Relaxation therapies, such as yoga.  Psychotherapy with a psychiatrist or therapist.  Avoidance of caffeine, alcohol and illegal drugs.  Prescribed medication. SEEK IMMEDIATE MEDICAL CARE IF:   You experience panic attack symptoms that are different than your usual symptoms.  You have any worsening or concerning symptoms. Document Released: 03/14/2005 Document Revised: 06/06/2011 Document Reviewed: 10/26/2012 [facility] Patient Information 2014 [place], [place].

## 2013-03-26 ENCOUNTER — Telehealth: Payer: Self-pay | Admitting: Internal Medicine

## 2013-03-26 NOTE — Telephone Encounter (Signed)
Pt states she was instructed to call if med not working and its not: (escitalopram (LEXAPRO) 10 MG tablet) pt states its causing more anxiety. Pt would like to try either wellbutrin, paxil or open to suggestions. Pt also needs refill of ALPRAZolam (XANAX) 0.5 MG tablet [facility] [address]

## 2013-03-29 ENCOUNTER — Other Ambulatory Visit: Payer: Self-pay | Admitting: Internal Medicine

## 2013-03-29 ENCOUNTER — Other Ambulatory Visit: Payer: Self-pay | Admitting: Family Medicine

## 2013-03-29 ENCOUNTER — Encounter: Payer: Self-pay | Admitting: Internal Medicine

## 2013-03-29 MED ORDER — ALPRAZOLAM 0.5 MG PO TABS: ORAL_TABLET | ORAL | Status: DC

## 2013-03-29 MED ORDER — VENLAFAXINE HCL 37.5 MG PO TABS: ORAL_TABLET | ORAL | Status: DC

## 2013-03-29 NOTE — Telephone Encounter (Signed)
refill alprazolam  #30  X2 ok  Would eith try  Los dose paxil , 5 mg per day increase to 10  sertraline 25mg  per day increase to 50 mg  Or effecer  XR 37.5  incrrease to 75   (Each in about 2 weeks ) Then fu viist or call in 3-4 weeks.

## 2013-03-29 NOTE — Telephone Encounter (Signed)
Pt states since she has not heard back from previous message she will need a refill of alprazolam.

## 2013-03-29 NOTE — Telephone Encounter (Signed)
Patient chose to try Effexor.  Sent to the pharmacy for 30 days.  Notified to make an appt in 3-4 weeks.  Alprazolam called to the pharmacy and left on voicemail.

## 2013-04-12 ENCOUNTER — Encounter: Payer: Self-pay | Admitting: Neurology

## 2013-04-23 ENCOUNTER — Other Ambulatory Visit: Payer: Self-pay | Admitting: Internal Medicine

## 2013-04-24 MED ORDER — VENLAFAXINE HCL 37.5 MG PO TABS: ORAL_TABLET | ORAL | Status: DC

## 2013-05-04 ENCOUNTER — Encounter: Payer: Self-pay | Admitting: Internal Medicine

## 2013-05-06 ENCOUNTER — Other Ambulatory Visit: Payer: Self-pay | Admitting: Family Medicine

## 2013-05-06 ENCOUNTER — Encounter: Payer: Self-pay | Admitting: Internal Medicine

## 2013-05-06 MED ORDER — VENLAFAXINE HCL ER 37.5 MG PO CP24: ORAL_CAPSULE | ORAL | Status: DC

## 2013-05-06 NOTE — Telephone Encounter (Signed)
Ok to refill xanax x 1 and  Have ov before runs out.

## 2013-05-06 NOTE — Telephone Encounter (Signed)
Please refill the effexor 37.5  ER  ( please make sure this is the  XR  And NOT immediate release ; it doesn't show this on the EHR!     and continue once a day  In the am 37.5 er  Would try to take 2 per day  Both in the am   At some point  But ok to stay low does. Needs FU visit in 3-4 weeks

## 2013-05-07 ENCOUNTER — Other Ambulatory Visit: Payer: Self-pay | Admitting: Family Medicine

## 2013-05-07 MED ORDER — ALPRAZOLAM 0.5 MG PO TABS: ORAL_TABLET | ORAL | Status: DC

## 2013-05-08 ENCOUNTER — Encounter: Payer: Self-pay | Admitting: Internal Medicine

## 2013-05-23 ENCOUNTER — Ambulatory Visit: Payer: 59 | Admitting: Internal Medicine

## 2013-06-06 ENCOUNTER — Ambulatory Visit (INDEPENDENT_AMBULATORY_CARE_PROVIDER_SITE_OTHER): Payer: BC Managed Care – PPO | Admitting: Internal Medicine

## 2013-06-06 ENCOUNTER — Encounter: Payer: Self-pay | Admitting: Internal Medicine

## 2013-06-06 VITALS — BP 112/72 | Temp 98.4°F | Ht 64.25 in | Wt 148.0 lb

## 2013-06-06 DIAGNOSIS — F410 Panic disorder [episodic paroxysmal anxiety] without agoraphobia: Secondary | ICD-10-CM

## 2013-06-06 DIAGNOSIS — R894 Abnormal immunological findings in specimens from other organs, systems and tissues: Secondary | ICD-10-CM

## 2013-06-06 DIAGNOSIS — M79609 Pain in unspecified limb: Secondary | ICD-10-CM

## 2013-06-06 DIAGNOSIS — M79604 Pain in right leg: Secondary | ICD-10-CM

## 2013-06-06 DIAGNOSIS — R768 Other specified abnormal immunological findings in serum: Secondary | ICD-10-CM

## 2013-06-06 DIAGNOSIS — F411 Generalized anxiety disorder: Secondary | ICD-10-CM

## 2013-06-06 DIAGNOSIS — Z79899 Other long term (current) drug therapy: Secondary | ICD-10-CM

## 2013-06-06 DIAGNOSIS — I7300 Raynaud's syndrome without gangrene: Secondary | ICD-10-CM

## 2013-06-06 DIAGNOSIS — M79605 Pain in left leg: Secondary | ICD-10-CM

## 2013-06-06 MED ORDER — VENLAFAXINE HCL ER 37.5 MG PO CP24: ORAL_CAPSULE | ORAL | Status: DC

## 2013-06-06 MED ORDER — ALPRAZOLAM 0.5 MG PO TABS: ORAL_TABLET | ORAL | Status: DC

## 2013-06-06 NOTE — Progress Notes (Signed)
Chief Complaint  Patient presents with  . Follow-up    anxiety has leg pains aches    HPI:  Fu of medication changes.effexor helping a good bit .  Was on IR  Weaning off the zanax  .  Jitteriness with 75 per day but hasn't  started the ER yet  .   May get used to it.  Just a week. Take xanax if needed Things not bothering her as much.  Not as anxious.  Not as snappy and more laid back and spouse has noted improvement.  Having leg aches and starts in stiz bones and both    Never started the gabapentin.    Leg pain best in am and pwrse as day goes on.   No injury tries to stay active dec with rest. Feels .loke flu in the legs   Hip dysplasia per ortho not running cause of this .    Remote hx of seeing dr D  10 years ago .   For ana and recurrent  preg loss  Was to see [facility] but never went  ROS: See pertinent positives and negatives per HPI. Gets raynauds   Past Medical History  Diagnosis Date  . ANXIETY DISORDER, GENERALIZED 10/10/2006    hospitalized  about age 57 after child  wih panic episode  . Irritable bowel syndrome 10/10/2006  . ALLERGIC RHINITIS CAUSE UNSPECIFIED 04/09/2008  . 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 Erythromelalgia 04/22/2009  . Hx of endometriosis   . History of miscarriage     x 2   . ANA positive   . Anxiety     Family History  Problem Relation Age of Onset  . Lupus Brother   . Anxiety disorder Mother   . Colon cancer Paternal Grandfather   . Esophageal cancer Neg Hx   . Stomach cancer Neg Hx   . Rectal cancer Neg Hx   . Memory loss Mother   . Other Father     polio    History   Social History  . Marital Status: Married    Spouse Name: Loraine Leriche    Number of Children: 1  . Years of Education: 50   Occupational History  .     Social History Main Topics  . Smoking status: Never Smoker   . Smokeless tobacco: Never Used  . Alcohol Use: 1.5 oz/week    3 drink(s) per week     Comment: 1-2 drinks daily   . Drug Use: No  . Sexual Activity:  None   Other Topics Concern  . None   Social History Narrative   Patient is married Government social research officer)   Wine at night max 1    Neg td    caffiene in am    Employed.    MSW    Patient has one child.   Patient works full-time, Presenter, broadcasting.   Patient has her Masters.    Outpatient Encounter Prescriptions as of 06/06/2013  Medication Sig  . ALPRAZolam (XANAX) 0.5 MG tablet 1/2 to 1 po if needed for anxiety up to tid  . fluticasone (FLONASE) 50 MCG/ACT nasal spray 2 spray each nostril qd  . Ibuprofen (ADVIL) 200 MG CAPS Take by mouth. Four a day  . Loratadine-Pseudoephedrine (CLARITIN-D 24 HOUR PO) Take by mouth.  . Melatonin 3 MG TABS Take by mouth.  . phenazopyridine (PYRIDIUM) 200 MG tablet Take 1 tablet (200 mg total) by mouth 3 (three) times daily as needed.  . [DISCONTINUED] ALPRAZolam Prudy Feeler) 0.5  MG tablet 1/2 to 1 po if needed for anxiety up to tid  . [DISCONTINUED] venlafaxine (EFFEXOR) 37.5 MG tablet Take 2 tabs daily  . gabapentin (NEURONTIN) 100 MG capsule Take 1 capsule (100 mg total) by mouth 3 (three) times daily.  Marland Kitchen. venlafaxine XR (EFFEXOR-XR) 37.5 MG 24 hr capsule Take 2 capsules daily  . [DISCONTINUED] escitalopram (LEXAPRO) 10 MG tablet Take 0.5 tablets (5 mg total) by mouth daily. Can increase to 10 mg per day.  . [DISCONTINUED] Multiple Vitamin (MULTIVITAMIN) tablet Take 1 tablet by mouth daily.    . [DISCONTINUED] omeprazole (PRILOSEC) 20 MG capsule Take 1 capsule (20 mg total) by mouth daily.  . [DISCONTINUED] venlafaxine (EFFEXOR) 37.5 MG tablet Take one table for 14 days then increase to two tabs daily  . [DISCONTINUED] venlafaxine XR (EFFEXOR-XR) 37.5 MG 24 hr capsule Take one capsule for 2 weeks and then increase to two capsules    EXAM:  BP 112/72  Temp(Src) 98.4 F (36.9 C) (Oral)  Ht 5' 4.25" (1.632 m)  Wt 148 lb (67.132 kg)  BMI 25.21 kg/m2  Body mass index is 25.21 kg/(m^2).  GENERAL: vitals reviewed and listed above, alert, oriented, appears well  hydrated and in no acute distress HEENT: atraumatic, conjunctiva  clear, no obvious abnormalities on inspection of external nose and earsNECK: no obvious masses on inspection palpation  LUNGS: clear to auscultation bilaterally, no wheezes, rales or rhonchi, good air movement CV: HRRR, no clubbing cyanosis or  peripheral edema nl cap refill  MS: moves all extremities without noticeable focal  Abnormality  Toes one turned blu during exan then better  No focal changes  PSYCH: pleasant and cooperative, less anxiety   ASSESSMENT AND PLAN:  Discussed the following assessment and plan:  Generalized anxiety disorder - improved continue ER planned and fu in 2-3 months PV cpx etc   Bilateral leg pain - newer onset acts like myalgia ? cause  rheum ocnsutl hx of pos ana raynauds preg loss fam hx lupus.  Panic disorder  Medication management - contract signed tox screen  Raynaud phenomenon  ANA positive refetr to rheum for new sx and fam hx autouimmune disease etc  -Patient advised to return or notify health care team  if symptoms worsen ,persist or new concerns arise.  Patient Instructions  Continue with changing to extended release effexor to avoid side effects. Alprazolam as needed . May not need as much with improvement of the anxiety. Will make a referral to rheumatology Dr Durenda Ageeveshewar about the left sx and autoimmune phenomenon.  I dont really think the medication is causing this at this time.    Erica Saunders M.D.  Pre visit review using our clinic review tool, if applicable. No additional management support is needed unless otherwise documented below in the visit note.

## 2013-06-06 NOTE — Patient Instructions (Signed)
Continue with changing to extended release effexor to avoid side effects. Alprazolam as needed . May not need as much with improvement of the anxiety. Will make a referral to rheumatology Dr Durenda Ageeveshewar about the left sx and autoimmune phenomenon.  I dont really think the medication is causing this at this time.

## 2013-06-17 ENCOUNTER — Ambulatory Visit: Payer: 59 | Admitting: Neurology

## 2013-07-01 ENCOUNTER — Encounter: Payer: Self-pay | Admitting: Internal Medicine

## 2013-08-20 ENCOUNTER — Telehealth: Payer: Self-pay | Admitting: Internal Medicine

## 2013-08-20 MED ORDER — ALPRAZOLAM 0.5 MG PO TABS: ORAL_TABLET | ORAL | Status: DC

## 2013-08-20 NOTE — Telephone Encounter (Signed)
Can disp#30 with 2  More refills .

## 2013-08-20 NOTE — Telephone Encounter (Signed)
Called to the pharmacy and left on voicemail. 

## 2013-08-20 NOTE — Telephone Encounter (Signed)
Pt needs refill on alprazolam 0.5 mg #90 call [facility] [address]

## 2013-09-11 ENCOUNTER — Telehealth: Payer: Self-pay | Admitting: Internal Medicine

## 2013-09-11 NOTE — Telephone Encounter (Signed)
Pt has a lump in rt breast and would like a diagnostic mamma gram at the breast center asap,. Advised pt dr Fabian Sharppanosh not in, she was hoping you could send the referral some way

## 2013-09-11 NOTE — Telephone Encounter (Signed)
Dr. Fabian SharpPanosh will usually have her patient's come in to be seen before ordering a mammogram.  Would you be willing to see this patient?

## 2013-09-11 NOTE — Telephone Encounter (Signed)
Neeps appt.in next week with PCP, gyn or other provider.

## 2013-09-12 NOTE — Telephone Encounter (Signed)
Noted  

## 2013-09-12 NOTE — Telephone Encounter (Signed)
Pt states she will get her obgyn to do the referral. Doesn't want the additional expense of coming in to the office for appt.

## 2013-09-17 ENCOUNTER — Other Ambulatory Visit: Payer: Self-pay | Admitting: Obstetrics and Gynecology

## 2013-09-17 DIAGNOSIS — N6001 Solitary cyst of right breast: Secondary | ICD-10-CM

## 2013-09-25 ENCOUNTER — Ambulatory Visit
Admission: RE | Admit: 2013-09-25 | Discharge: 2013-09-25 | Disposition: A | Discharge status: Home / Self Care | Payer: BC Managed Care – PPO | Source: Ambulatory Visit | Attending: Obstetrics and Gynecology | Admitting: Obstetrics and Gynecology

## 2013-09-25 DIAGNOSIS — N6001 Solitary cyst of right breast: Secondary | ICD-10-CM

## 2013-11-10 ENCOUNTER — Other Ambulatory Visit: Payer: Self-pay | Admitting: Internal Medicine

## 2013-11-11 NOTE — Telephone Encounter (Signed)
Has an upcoming appt on 12/04/13 for yearly.  Refilled for 90 days

## 2013-11-27 ENCOUNTER — Other Ambulatory Visit: Payer: BC Managed Care – PPO

## 2013-11-29 ENCOUNTER — Other Ambulatory Visit: Payer: BC Managed Care – PPO

## 2013-12-03 ENCOUNTER — Other Ambulatory Visit: Payer: Self-pay | Admitting: Obstetrics and Gynecology

## 2013-12-04 ENCOUNTER — Encounter: Payer: BC Managed Care – PPO | Admitting: Internal Medicine

## 2013-12-04 LAB — CYTOLOGY - PAP

## 2014-03-04 ENCOUNTER — Other Ambulatory Visit: Payer: Self-pay | Admitting: Family Medicine

## 2014-12-09 ENCOUNTER — Other Ambulatory Visit: Payer: Self-pay | Admitting: Obstetrics and Gynecology

## 2014-12-10 LAB — CYTOLOGY - PAP

## 2015-12-28 ENCOUNTER — Ambulatory Visit (INDEPENDENT_AMBULATORY_CARE_PROVIDER_SITE_OTHER): Payer: BLUE CROSS/BLUE SHIELD | Admitting: Rheumatology

## 2015-12-28 ENCOUNTER — Ambulatory Visit: Payer: Self-pay | Admitting: Rheumatology

## 2015-12-28 DIAGNOSIS — M79671 Pain in right foot: Secondary | ICD-10-CM | POA: Diagnosis not present

## 2015-12-28 DIAGNOSIS — I7300 Raynaud's syndrome without gangrene: Secondary | ICD-10-CM | POA: Diagnosis not present

## 2015-12-28 DIAGNOSIS — M79642 Pain in left hand: Secondary | ICD-10-CM

## 2015-12-28 DIAGNOSIS — M7071 Other bursitis of hip, right hip: Secondary | ICD-10-CM | POA: Diagnosis not present

## 2015-12-28 DIAGNOSIS — M79672 Pain in left foot: Secondary | ICD-10-CM

## 2015-12-28 DIAGNOSIS — R5381 Other malaise: Secondary | ICD-10-CM

## 2015-12-28 DIAGNOSIS — M79641 Pain in right hand: Secondary | ICD-10-CM | POA: Diagnosis not present

## 2016-01-05 LAB — CBC AND DIFFERENTIAL
HCT: 42 % (ref 36–46)
Hemoglobin: 13.6 g/dL (ref 12.0–16.0)
PLATELETS: 349 10*3/uL (ref 150–399)
WBC: 5.6 10*3/mL

## 2016-01-05 LAB — HEPATIC FUNCTION PANEL
ALT: 10 U/L (ref 7–35)
AST: 18 U/L (ref 13–35)
Alkaline Phosphatase: 58 U/L (ref 25–125)
Bilirubin, Total: 0.6 mg/dL

## 2016-01-05 LAB — BASIC METABOLIC PANEL
BUN: 13 mg/dL (ref 4–21)
CREATININE: 0.9 mg/dL (ref 0.5–1.1)
GLUCOSE: 69 mg/dL
POTASSIUM: 4.2 mmol/L (ref 3.4–5.3)
Sodium: 138 mmol/L (ref 137–147)

## 2016-01-26 ENCOUNTER — Encounter: Payer: Self-pay | Admitting: *Deleted

## 2016-01-26 DIAGNOSIS — M51369 Other intervertebral disc degeneration, lumbar region without mention of lumbar back pain or lower extremity pain: Secondary | ICD-10-CM

## 2016-01-26 DIAGNOSIS — R51 Headache: Secondary | ICD-10-CM

## 2016-01-26 DIAGNOSIS — F419 Anxiety disorder, unspecified: Secondary | ICD-10-CM

## 2016-01-26 DIAGNOSIS — M5136 Other intervertebral disc degeneration, lumbar region: Secondary | ICD-10-CM

## 2016-01-26 DIAGNOSIS — I7300 Raynaud's syndrome without gangrene: Secondary | ICD-10-CM

## 2016-01-26 DIAGNOSIS — R519 Headache, unspecified: Secondary | ICD-10-CM

## 2016-01-26 HISTORY — DX: Raynaud's syndrome without gangrene: I73.00

## 2016-01-26 HISTORY — DX: Other intervertebral disc degeneration, lumbar region without mention of lumbar back pain or lower extremity pain: M51.369

## 2016-01-26 HISTORY — DX: Headache, unspecified: R51.9

## 2016-01-26 HISTORY — DX: Other intervertebral disc degeneration, lumbar region: M51.36

## 2016-01-27 ENCOUNTER — Inpatient Hospital Stay (INDEPENDENT_AMBULATORY_CARE_PROVIDER_SITE_OTHER): Payer: Self-pay

## 2016-01-27 ENCOUNTER — Ambulatory Visit (INDEPENDENT_AMBULATORY_CARE_PROVIDER_SITE_OTHER): Payer: BLUE CROSS/BLUE SHIELD | Admitting: Rheumatology

## 2016-01-27 ENCOUNTER — Encounter: Payer: Self-pay | Admitting: *Deleted

## 2016-01-27 DIAGNOSIS — N3010 Interstitial cystitis (chronic) without hematuria: Secondary | ICD-10-CM

## 2016-01-27 DIAGNOSIS — M5136 Other intervertebral disc degeneration, lumbar region: Secondary | ICD-10-CM

## 2016-01-27 DIAGNOSIS — F411 Generalized anxiety disorder: Secondary | ICD-10-CM

## 2016-01-27 DIAGNOSIS — M791 Myalgia: Secondary | ICD-10-CM

## 2016-01-27 DIAGNOSIS — M79641 Pain in right hand: Secondary | ICD-10-CM

## 2016-01-27 DIAGNOSIS — I7300 Raynaud's syndrome without gangrene: Secondary | ICD-10-CM

## 2016-01-27 DIAGNOSIS — M79642 Pain in left hand: Secondary | ICD-10-CM

## 2016-01-27 DIAGNOSIS — M7918 Myalgia, other site: Secondary | ICD-10-CM

## 2016-01-27 DIAGNOSIS — K589 Irritable bowel syndrome without diarrhea: Secondary | ICD-10-CM

## 2016-01-27 NOTE — Progress Notes (Signed)
*  IMAGE* Office Visit Note  Patient: Erica Saunders             Date of Birth: 09/21/1970           MRN: [id number]             PCP: Lorretta HarpPANOSH,WANDA KOTVAN, MD Referring: Madelin HeadingsPanosh, Wanda K, MD Visit Date: 01/27/2016 Occupation:@GUAROCC @    Subjective:  Pain hands   History of Present Illness: Erica BoyerCarli A Oscarson is a 45 y.o. female with history of Raynaud months and pain in hands. She comes here to get ultrasound examination of bilateral hands for evaluation.    No Rheumatology ROS completed.    Objective: Vital Signs: There were no vitals taken for this visit.   Physical Exam not performed  Musculoskeletal Exam:   CDAI Exam: No CDAI exam completed.    Investigation: Findings:  Labs from 12/28/2015 CMP normal,ANCA negative, CBC normal, sedimentation rate 1, hep panel negative, G6PD normal, CK normal ace negative, TSH 4.75 which is high, immunoglobulins normal ANA +1:40 nucleolar, C3-C4 normal SPEP normal UA negative better to negative anticardiolipin negative double-stranded DNA 6 which was intermediate rest of the unit panel was negative cryoglobulins was not performed, LA negative    Imaging: Koreas Extrem Up Bilat Comp  Result Date: 01/27/2016 Ultrasound of bilateral hands was performed per your recommendations. Using 12 MHz transducer grayscale and power Doppler bilateral second third and fifth MCP joints and bilateral wrist joints both dorsal and volar aspects were evaluated There was no synovitis or tenosynovitis on examination. Her right median nerve was 0.09 cm squares and left median nerve was 0.08 cm squares which were within normal limits. Impression: She had no synovitis or tenosynovitis on examination and median nerves are within normal limits.   Speciality Comments: No specialty comments available.    Procedures:  No procedures performed Allergies: Penicillins; Celexa [citalopram hydrobromide]; Trazodone and nefazodone; Ceftin [cefuroxime]; and Prozac [fluoxetine  hcl]

## 2016-02-05 NOTE — Progress Notes (Signed)
Patient requested "no charge" for [place] of hands at appt. 01/27/16 even though that was the correct procedure order. Patient stated after the US of the hands that her feet were hurting and wanted an [place] of her feet. Dr. Estanislado Pandy did not have time to do an US of the patients feet that day but offered to schedule the [place] of her feet another day. Per Rosaria Ferries, patient called requesting "no charge" for the [place] of her hands. Per Dr. Estanislado Pandy, patient should be charged for [place] of hands since that was the correct order and the procedure was discussed with the patient before the Korea was performed. I called patient, patient did not want to schedule an [place] of her feet and said she was going to another rheumatologist because she felt her needs were not being met.

## 2016-02-08 ENCOUNTER — Other Ambulatory Visit: Payer: Self-pay

## 2016-09-07 ENCOUNTER — Ambulatory Visit (INDEPENDENT_AMBULATORY_CARE_PROVIDER_SITE_OTHER): Payer: BLUE CROSS/BLUE SHIELD | Admitting: Adult Health

## 2016-09-07 ENCOUNTER — Encounter: Payer: Self-pay | Admitting: Adult Health

## 2016-09-07 VITALS — BP 116/82 | Temp 98.2°F | Ht 64.0 in | Wt 157.4 lb

## 2016-09-07 DIAGNOSIS — Z7689 Persons encountering health services in other specified circumstances: Secondary | ICD-10-CM | POA: Diagnosis not present

## 2016-09-07 DIAGNOSIS — K219 Gastro-esophageal reflux disease without esophagitis: Secondary | ICD-10-CM | POA: Diagnosis not present

## 2016-09-07 DIAGNOSIS — N3010 Interstitial cystitis (chronic) without hematuria: Secondary | ICD-10-CM

## 2016-09-07 MED ORDER — OMEPRAZOLE 20 MG PO CPDR: 20.0000 mg | DELAYED_RELEASE_CAPSULE | Freq: Every day | ORAL | 1 refills | Status: DC

## 2016-09-07 NOTE — Progress Notes (Signed)
Patient presents to clinic today to establish care. 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   It appears as though her last CPE was back in 2013 with Dr. Fabian Sharp. She has had lab work done at work.   Acute Concerns: Establish Care   GERD - Reports as a new issue. She feels as though it can be hard to swallow food at random times. Endorses epigastric pain. She has been started on new medications. Does not endorse waking up feeling as though she has a sour taste in her mouth when she wakes up.   Chronic Issues: IC - Sees Urology. She will be having surgery in July 2018. This will be her fourth surgery. She is currently taking Atarax  Erythromelalgia - She was recently placed on Gabapentin. Feels as though this is starting to work. Followed by Rheumatology   Anxiety - Takes xanax ER nightly and takes an additional dose of 0.5 mg as needed. This medication is prescribed by GYN?  Health Maintenance: Dental --Routine  Vision --Does not do routine Immunizations -- UTD  Mammogram -- 2016  PAP -- 2017  Colonoscopy: 2013  Diet: She tries to eat healthy  Exercise: She exercises on a regular basis    She is followed by   - Urology - Dr. Logan Bores  - Rheumatology  - GYN - Dr. Adalberto Ill.    Past Medical History:  Diagnosis Date  . ALLERGIC RHINITIS CAUSE UNSPECIFIED 04/09/2008  . ANA positive   . Anxiety   . ANXIETY DISORDER, GENERALIZED 10/10/2006   hospitalized  about age 73 after child  wih panic episode  . DDD (degenerative disc disease), lumbar 01/26/2016  . Erythromelalgia (HCC)  04/22/2009  . Headache 01/26/2016  . History of miscarriage    x 2   . Hx of endometriosis   . 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 Irritable bowel syndrome 10/10/2006  . Raynaud's disease 01/26/2016    Past Surgical History:  Procedure Laterality Date  . ABDOMINAL HYSTERECTOMY    . APPENDECTOMY    . CESAREAN SECTION    . CHOLECYSTECTOMY    . DILATION AND CURETTAGE OF UTERUS    . LAPAROSCOPY     endometriosis  . TONSILLECTOMY AND ADENOIDECTOMY      Current Outpatient Prescriptions on File Prior to Visit  Medication Sig Dispense Refill  . ALPRAZolam (XANAX) 0.5 MG tablet 1/2 to 1 po if needed for anxiety up to tid 30 tablet 2  . fluticasone (FLONASE) 50 MCG/ACT nasal spray 2 spray each nostril qd 16 g 3  . gabapentin (NEURONTIN) 100 MG capsule Take 1 capsule (100 mg total) by mouth 3 (three) times daily. 90 capsule 3  . Ibuprofen (ADVIL) 200 MG CAPS Take by mouth. Four a day    . Loratadine-Pseudoephedrine (CLARITIN-D 24 HOUR PO) Take by mouth.    . Melatonin 3 MG TABS Take by mouth.    . phenazopyridine (PYRIDIUM) 200 MG tablet Take 1 tablet (200 mg total) by mouth 3 (three) times daily as needed. 15 tablet 0  . traMADol (ULTRAM) 50 MG tablet Take 50 mg by mouth at bedtime as needed.    . venlafaxine XR (EFFEXOR-XR) 37.5 MG 24 hr capsule TAKE 2 CAPSULES  DAILY 180 capsule 0   No current facility-administered medications on file prior to visit.     Allergies  Allergen Reactions  . Penicillins Anaphylaxis    REACTION: rash---red face  . Celexa [Citalopram Hydrobromide]     Tic as side effect  . Trazodone And Nefazodone Other (See Comments)    Excess sedation  . Ceftin [Cefuroxime] Nausea Only  . Prozac [Fluoxetine Hcl] Hives    Can take paxil     Family History  Problem Relation Age of Onset  . Anxiety disorder Mother   . Memory loss Mother   . Hyperlipidemia Mother   . Other Father        polio  . Lupus Brother   . Colon cancer Paternal Grandfather   . Diabetes  Paternal Grandfather   . Esophageal cancer Neg Hx   . Stomach cancer Neg Hx   . Rectal cancer Neg Hx     Social History   Social History  . Marital status: Married    Spouse name: Loraine Leriche  . Number of children: 1  . Years of education: 44   Occupational History  .  [employer]   Social History Main Topics  . Smoking status: Never Smoker  . Smokeless tobacco: Never Used  . Alcohol use 1.5 oz/week    3 drink(s) per week     Comment: 1-2 drinks daily   . Drug use: No  . Sexual activity: Not on file   Other Topics Concern  . Not on file   Social History Narrative   Patient is married Government social research officer)   Wine at night max 1    Neg td    caffiene in am    Employed.    MSW    Patient has one child.   Patient works full-time, Presenter, broadcasting.   Patient has her Masters.    Review of Systems  Constitutional: Negative.   HENT: Negative.   Eyes: Negative.   Respiratory: Negative.   Cardiovascular: Negative.   Gastrointestinal: Positive for abdominal pain and heartburn.  Genitourinary: Positive for frequency and urgency.  Musculoskeletal: Negative.   Skin: Negative.   Neurological: Negative.   Endo/Heme/Allergies: Negative.   Psychiatric/Behavioral: The patient is nervous/anxious.   All other systems reviewed and are negative.   BP 116/82 (BP Location: Left Arm, Patient Position: Sitting, Cuff Size: Normal)   Temp 98.2 F (36.8 C) (Oral)   Ht 5\' 4"  (1.626 m)   Wt 157 lb 6.4 oz (71.4 kg)   BMI 27.02 kg/m   Physical Exam  Constitutional: She is oriented to person, place, and time and well-developed, well-nourished, and in no distress. No distress.  Neck: Normal range of motion. Neck supple. No thyromegaly present.  Cardiovascular: Normal rate, regular rhythm, normal heart sounds and intact distal pulses.  Exam reveals no gallop and no friction rub.   No murmur heard. Pulmonary/Chest: Effort normal and breath sounds normal. No respiratory distress. She has no  wheezes. She exhibits no tenderness.  Abdominal: Soft. Normal appearance and bowel sounds are normal. She exhibits no distension and no mass. There is tenderness in the epigastric area and suprapubic area. There is no rebound and no guarding.  Musculoskeletal: Normal range of motion. She exhibits no edema, tenderness or deformity.  Lymphadenopathy:    She has no cervical adenopathy.  Neurological: She is alert and oriented to person, place, and time. Gait normal.  Skin: Skin is warm and dry. No rash noted. She is not diaphoretic. No  erythema. No pallor.  Psychiatric: Memory, affect and judgment normal.  Nursing note and vitals reviewed.    Assessment/Plan: 1. Encounter to establish care - Follow up for CPE  - Follow up sooner with any acute issues.  - Continue to eat healthy and exercise   2. Gastroesophageal reflux disease without esophagitis - Exam consistent with GERD like symptoms. I will trial her on Prilosec for one month to see if her symptoms resolve  - omeprazole (PRILOSEC) 20 MG capsule; Take 1 capsule (20 mg total) by mouth daily.  Dispense: 30 capsule; Refill: 1  3. INTERSTITIAL CYSTITIS - Continue follow up with Urology    Shirline Freesory Montzerrat Brunell, NP

## 2016-09-12 ENCOUNTER — Encounter: Payer: Self-pay | Admitting: Adult Health

## 2016-09-12 NOTE — Telephone Encounter (Signed)
Attempted to call pt, n/a and no vm.  WCB tomorrow.

## 2016-09-12 NOTE — Telephone Encounter (Signed)
Pt following up on message she sent concerning stomach. Pt states the pain is excruciating.  Stomach burning and pain.  Would like someone to call her.  [phone]

## 2016-09-13 ENCOUNTER — Other Ambulatory Visit: Payer: Self-pay | Admitting: Adult Health

## 2016-09-13 DIAGNOSIS — K219 Gastro-esophageal reflux disease without esophagitis: Secondary | ICD-10-CM

## 2016-09-13 MED ORDER — OMEPRAZOLE 40 MG PO CPDR: DELAYED_RELEASE_CAPSULE | ORAL | 1 refills | Status: DC

## 2016-10-05 ENCOUNTER — Encounter: Payer: Self-pay | Admitting: Adult Health

## 2016-12-16 ENCOUNTER — Encounter: Payer: Self-pay | Admitting: Adult Health

## 2016-12-22 ENCOUNTER — Encounter: Payer: Self-pay | Admitting: Adult Health

## 2016-12-22 ENCOUNTER — Ambulatory Visit (INDEPENDENT_AMBULATORY_CARE_PROVIDER_SITE_OTHER): Payer: BLUE CROSS/BLUE SHIELD | Admitting: Adult Health

## 2016-12-22 VITALS — BP 94/66 | Temp 97.8°F | Ht 64.0 in | Wt 157.0 lb

## 2016-12-22 DIAGNOSIS — E039 Hypothyroidism, unspecified: Secondary | ICD-10-CM | POA: Diagnosis not present

## 2016-12-22 DIAGNOSIS — Z0000 Encounter for general adult medical examination without abnormal findings: Secondary | ICD-10-CM | POA: Diagnosis not present

## 2016-12-22 DIAGNOSIS — Z23 Encounter for immunization: Secondary | ICD-10-CM | POA: Diagnosis not present

## 2016-12-22 LAB — LIPID PANEL
CHOL/HDL RATIO: 2
Cholesterol: 190 mg/dL (ref 0–200)
HDL: 93.3 mg/dL (ref >39.00)
LDL Cholesterol: 87 mg/dL (ref 0–99)
NonHDL: 97.01
TRIGLYCERIDES: 48 mg/dL (ref 0.0–149.0)
VLDL: 9.6 mg/dL (ref 0.0–40.0)

## 2016-12-22 LAB — CBC WITH DIFFERENTIAL/PLATELET
BASOS ABS: 0.1 10*3/uL (ref 0.0–0.1)
Basophils Relative: 1.2 % (ref 0.0–3.0)
EOS ABS: 0.1 10*3/uL (ref 0.0–0.7)
Eosinophils Relative: 2 % (ref 0.0–5.0)
HCT: 40.9 % (ref 36.0–46.0)
Hemoglobin: 13.4 g/dL (ref 12.0–15.0)
LYMPHS ABS: 1.2 10*3/uL (ref 0.7–4.0)
Lymphocytes Relative: 27 % (ref 12.0–46.0)
MCHC: 32.9 g/dL (ref 30.0–36.0)
MCV: 89.7 fl (ref 78.0–100.0)
MONOS PCT: 7.9 % (ref 3.0–12.0)
Monocytes Absolute: 0.4 10*3/uL (ref 0.1–1.0)
NEUTROS ABS: 2.8 10*3/uL (ref 1.4–7.7)
Neutrophils Relative %: 61.9 % (ref 43.0–77.0)
PLATELETS: 312 10*3/uL (ref 150.0–400.0)
RBC: 4.56 Mil/uL (ref 3.87–5.11)
RDW: 13.1 % (ref 11.5–15.5)
WBC: 4.6 10*3/uL (ref 4.0–10.5)

## 2016-12-22 LAB — BASIC METABOLIC PANEL
BUN: 16 mg/dL (ref 6–23)
CALCIUM: 9.8 mg/dL (ref 8.4–10.5)
CO2: 28 meq/L (ref 19–32)
CREATININE: 0.95 mg/dL (ref 0.40–1.20)
Chloride: 103 mEq/L (ref 96–112)
GFR: 67.59 mL/min (ref >60.00)
GLUCOSE: 83 mg/dL (ref 70–99)
Potassium: 4.4 mEq/L (ref 3.5–5.1)
Sodium: 139 mEq/L (ref 135–145)

## 2016-12-22 LAB — TSH: TSH: 1.86 u[IU]/mL (ref 0.35–4.50)

## 2016-12-22 LAB — HEMOGLOBIN A1C: Hgb A1c MFr Bld: 5.1 % (ref 4.6–6.5)

## 2016-12-22 NOTE — Patient Instructions (Signed)
It was great seeing you today!   I will follow up with you regarding your lab work  ' If you need a second opinion, please let me know   Have a great year!

## 2016-12-22 NOTE — Progress Notes (Signed)
Subjective:    Patient ID: Erica Saunders, female    DOB: 1971/02/25, 46 y.o.   MRN: [id number]  HPI  Patient presents for yearly preventative medicine examination. 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   IC - Is followed by Urology. She had surgery in July but did not get the results she was hoping for. She has been prescribed a valium suppository by her GYN and she feels as though this helps the most.   Erythromelalgia - Is followed by Rheumatology.   Anxiety - Takes xanax ER nightly and takes an additional dose of 0.5 mg as needed.   Hypothyroidism - She takes Synthroid 25 mcg   All immunizations and health maintenance protocols were reviewed with the patient and needed orders were placed.  Appropriate screening laboratory values were ordered for the patient including screening of hyperlipidemia, renal function and hepatic function.  Medication reconciliation,  past medical history, social history, problem list and allergies were reviewed in detail with the patient  Goals were established with regard to weight loss, exercise, and  diet in compliance with medications  She is up to date on her pap. Last mammogram was in 2015. She is followed by GYN and will see her in November   Review of Systems  Constitutional: Negative.   HENT: Negative.   Eyes: Negative.   Respiratory: Negative.   Cardiovascular: Negative.   Gastrointestinal: Positive for abdominal pain (chronic ).  Endocrine: Negative.   Genitourinary: Negative.   Musculoskeletal: Negative.   Skin: Negative.     Allergic/Immunologic: Negative.   Neurological: Positive for numbness (chronic - in fingers and toes ).  Hematological: Negative.   Psychiatric/Behavioral: Negative.   All other systems reviewed and are negative.  Past Medical History:  Diagnosis Date  . ALLERGIC RHINITIS CAUSE UNSPECIFIED 04/09/2008  . ANA positive   . Anxiety   . ANXIETY DISORDER, GENERALIZED 10/10/2006   hospitalized  about age 40 after child  wih panic episode  . DDD (degenerative disc disease), lumbar 01/26/2016  . Erythromelalgia (HCC) 04/22/2009  . Headache 01/26/2016  . History of miscarriage    x 2   . Hx of endometriosis   . 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 Irritable bowel syndrome 10/10/2006  . Raynaud's disease 01/26/2016    Social History   Social History  . Marital status: Married    Spouse name: Loraine Leriche  . Number of children: 1  . Years of education: 34   Occupational History  .  [employer]   Social History Main Topics  . Smoking status: Never Smoker  . Smokeless tobacco: Never Used  . Alcohol use 3.0 oz/week    5 Glasses of wine per week  . Drug use: No  . Sexual activity: Not on file   Other Topics Concern  . Not on file   Social History Narrative   Patient is married Government social research officer)   Wine at night max 1    Neg td    caffiene in am    Employed.    MSW    Patient has one child.   Patient works full-time, Presenter, broadcasting.   Patient has her Masters.    Past Surgical History:  Procedure Laterality  Date  . ABDOMINAL HYSTERECTOMY    . APPENDECTOMY    . CESAREAN SECTION    . CHOLECYSTECTOMY    . DILATION AND CURETTAGE OF UTERUS    . LAPAROSCOPY     endometriosis  . TONSILLECTOMY AND ADENOIDECTOMY      Family History  Problem Relation Age of Onset  . Anxiety disorder Mother   . Memory loss Mother   . Hyperlipidemia Mother   . Other Father        polio  . Lupus Brother   . Colon cancer Paternal Grandfather   . Diabetes Paternal Grandfather   . Esophageal  cancer Neg Hx   . Stomach cancer Neg Hx   . Rectal cancer Neg Hx     Allergies  Allergen Reactions  . Penicillins Anaphylaxis    REACTION: rash---red face  . Celexa [Citalopram Hydrobromide]     Tic as side effect  . Trazodone And Nefazodone Other (See Comments)    Excess sedation  . Ceftin [Cefuroxime] Nausea Only  . Prozac [Fluoxetine Hcl] Hives    Can take paxil     Current Outpatient Prescriptions on File Prior to Visit  Medication Sig Dispense Refill  . ALPRAZolam (XANAX XR) 0.5 MG 24 hr tablet TK 1 T PO  Q 8 H  0  . ALPRAZolam (XANAX) 0.5 MG tablet 1/2 to 1 po if needed for anxiety up to tid 30 tablet 2  . Ibuprofen (ADVIL) 200 MG CAPS Take by mouth. Four a day    . levothyroxine (SYNTHROID, LEVOTHROID) 25 MCG tablet TK 1 T PO D  6  . Loratadine-Pseudoephedrine (CLARITIN-D 24 HOUR PO) Take by mouth.    . phenazopyridine (PYRIDIUM) 200 MG tablet Take 1 tablet (200 mg total) by mouth 3 (three) times daily as needed. 15 tablet 0  . traMADol (ULTRAM) 50 MG tablet Take 50 mg by mouth at bedtime as needed.     No current facility-administered medications on file prior to visit.     BP 94/66 (BP Location: Left Arm)   Temp 97.8 F (36.6 C) (Oral)   Ht  (1.626 m)   Wt 157 lb (71.2 kg)   BMI 26.95 kg/m       Objective:   Physical Exam  Constitutional: She is oriented to person, place, and time. She appears well-developed and well-nourished. No distress.  HENT:  Head: Normocephalic and atraumatic.  Right Ear: External ear normal.  Left Ear: External ear normal.  Nose: Nose normal.  Mouth/Throat: Oropharynx is clear and moist. No oropharyngeal exudate.  Eyes: Pupils are equal, round, and reactive to light. Conjunctivae and EOM are normal. Right eye exhibits no discharge. Left eye exhibits no discharge. No scleral icterus.  Neck: Normal range of motion. Neck supple. No JVD present. No tracheal deviation present. No thyromegaly present.  Cardiovascular: Normal rate,  regular rhythm, normal heart sounds and intact distal pulses.  Exam reveals no gallop and no friction rub.   No murmur heard. Pulmonary/Chest: Effort normal and breath sounds normal. No stridor. No respiratory distress. She has no wheezes. She has no rales. She exhibits no tenderness.  Abdominal: Soft. Normal appearance and bowel sounds are normal. She exhibits no distension and no mass. There is no hepatosplenomegaly, splenomegaly or hepatomegaly. There is tenderness in the periumbilical area and suprapubic area. There is no rebound and no guarding.  Musculoskeletal: Normal range of motion. She exhibits no edema, tenderness or deformity.  Lymphadenopathy:    She has no cervical  adenopathy.  Neurological: She is alert and oriented to person, place, and time. She has normal reflexes. She displays normal reflexes. No cranial nerve deficit. She exhibits normal muscle tone. Coordination normal.  Skin: Skin is warm and dry. No rash noted. She is not diaphoretic. No erythema. No pallor.  Psychiatric: She has a normal mood and affect. Her behavior is normal. Judgment and thought content normal.  Nursing note and vitals reviewed.     Assessment & Plan:  1. Routine general medical examination at a health care facility  - Basic metabolic panel - CBC with Differential/Platelet - Hemoglobin A1c - Lipid panel - TSH  2. Hypothyroidism, unspecified type  - Basic metabolic panel - CBC with Differential/Platelet - Hemoglobin A1c - Lipid panel - TSH  3. Need for influenza vaccination  - Flu Vaccine QUAD 6+ mos PF IM (Fluarix Quad PF)

## 2017-09-11 ENCOUNTER — Ambulatory Visit: Payer: 59 | Admitting: Psychology

## 2017-09-26 ENCOUNTER — Ambulatory Visit: Payer: 59 | Admitting: Psychology

## 2017-09-26 ENCOUNTER — Ambulatory Visit (INDEPENDENT_AMBULATORY_CARE_PROVIDER_SITE_OTHER): Payer: 59 | Admitting: Psychology

## 2017-09-26 DIAGNOSIS — F410 Panic disorder [episodic paroxysmal anxiety] without agoraphobia: Secondary | ICD-10-CM | POA: Diagnosis not present

## 2017-09-26 DIAGNOSIS — F321 Major depressive disorder, single episode, moderate: Secondary | ICD-10-CM

## 2017-10-09 ENCOUNTER — Ambulatory Visit (INDEPENDENT_AMBULATORY_CARE_PROVIDER_SITE_OTHER): Payer: 59 | Admitting: Psychology

## 2017-10-09 ENCOUNTER — Ambulatory Visit: Payer: 59 | Admitting: Psychology

## 2017-10-09 DIAGNOSIS — F321 Major depressive disorder, single episode, moderate: Secondary | ICD-10-CM

## 2017-10-09 DIAGNOSIS — F410 Panic disorder [episodic paroxysmal anxiety] without agoraphobia: Secondary | ICD-10-CM

## 2017-11-10 ENCOUNTER — Ambulatory Visit: Payer: 59 | Admitting: Psychology

## 2017-12-01 ENCOUNTER — Ambulatory Visit: Payer: 59 | Admitting: Psychology

## 2017-12-15 ENCOUNTER — Ambulatory Visit: Payer: 59 | Admitting: Psychology

## 2018-05-16 ENCOUNTER — Ambulatory Visit (INDEPENDENT_AMBULATORY_CARE_PROVIDER_SITE_OTHER): Payer: Managed Care, Other (non HMO) | Admitting: Adult Health

## 2018-05-16 ENCOUNTER — Encounter: Payer: Self-pay | Admitting: Adult Health

## 2018-05-16 VITALS — BP 110/80 | Temp 98.3°F | Wt 155.0 lb

## 2018-05-16 DIAGNOSIS — R05 Cough: Secondary | ICD-10-CM

## 2018-05-16 DIAGNOSIS — J0140 Acute pansinusitis, unspecified: Secondary | ICD-10-CM | POA: Diagnosis not present

## 2018-05-16 DIAGNOSIS — R059 Cough, unspecified: Secondary | ICD-10-CM

## 2018-05-16 LAB — POC INFLUENZA A&B (BINAX/QUICKVUE)
INFLUENZA B, POC: NEGATIVE
Influenza A, POC: NEGATIVE

## 2018-05-16 MED ORDER — AZITHROMYCIN 250 MG PO TABS: ORAL_TABLET | ORAL | 0 refills | Status: DC

## 2018-05-16 MED ORDER — HYDROCODONE-CHLORPHENIRAMINE 5-4 MG/5ML PO SOLN: 5.0000 mL | Freq: Every day | ORAL | 0 refills | Status: DC

## 2018-05-16 NOTE — Progress Notes (Signed)
Subjective:    Patient ID: Erica Saunders, female    DOB: October 30, 1970, 48 y.o.   MRN: [id number]  Sinusitis  This is a new problem. The current episode started in the past 7 days. The problem has been rapidly worsening since onset. There has been no fever. Associated symptoms include chills, congestion, coughing (productive ), ear pain, headaches, sinus pressure and a sore throat. Pertinent negatives include no shortness of breath. Treatments tried: Zyrtec  The treatment provided no relief.       Review of Systems  Constitutional: Positive for activity change, chills and fatigue. Negative for appetite change and fever.  HENT: Positive for congestion, ear pain, postnasal drip, rhinorrhea, sinus pressure, sinus pain and sore throat. Negative for trouble swallowing.   Respiratory: Positive for cough (productive ). Negative for chest tightness, shortness of breath and wheezing.   Cardiovascular: Negative.   Neurological: Positive for headaches.  Psychiatric/Behavioral: Negative.    See HPI   Past Medical History:  Diagnosis Date  . ALLERGIC RHINITIS CAUSE UNSPECIFIED 04/09/2008  . ANA positive   . Anxiety   . ANXIETY DISORDER, GENERALIZED 10/10/2006   hospitalized  about age 42 after child  wih panic episode  . DDD (degenerative disc disease), lumbar 01/26/2016  . Erythromelalgia (HCC) 04/22/2009  . Headache 01/26/2016  . History of miscarriage    x 2   . Hx of endometriosis   . 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 Irritable bowel syndrome 10/10/2006  . Raynaud's disease 01/26/2016    Social History   Socioeconomic History  . Marital status: Married    Spouse name: Loraine Leriche  . Number of children: 1  . Years of education: 63  . Highest education level: Not on file  Occupational History    Employer: [employer]  Social Needs  . Financial resource strain: Not on file  . Food insecurity:    Worry: Not on file    Inability: Not on file  . Transportation needs:   Medical: Not on file    Non-medical: Not on file  Tobacco Use  . Smoking status: Never Smoker  . Smokeless tobacco: Never Used  Substance and Sexual Activity  . Alcohol use: Yes    Alcohol/week: 5.0 standard drinks    Types: 5 Glasses of wine per week  . Drug use: No  . Sexual activity: Not on file  Lifestyle  . Physical activity:    Days per week: Not on file    Minutes per session: Not on file  . Stress: Not on file  Relationships  . Social connections:    Talks on phone: Not on file    Gets together: Not on file    Attends religious service: Not on file    Active member of club or organization: Not on file    Attends meetings of clubs or organizations: Not on file    Relationship status: Not on file  . Intimate partner violence:    Fear of current or ex partner: Not on file    Emotionally abused: Not on file    Physically abused: Not on file    Forced sexual activity: Not on file  Other Topics Concern  . Not on file  Social History Narrative   Patient is married Government social research officer)   Wine at night max 1    Neg td    caffiene in am    Employed.    MSW    Patient has one child.   Patient works  full-time, Presenter, broadcastingHuman Resources.   Patient has her Masters.    Past Surgical History:  Procedure Laterality Date  . ABDOMINAL HYSTERECTOMY    . APPENDECTOMY    . CESAREAN SECTION    . CHOLECYSTECTOMY    . DILATION AND CURETTAGE OF UTERUS    . LAPAROSCOPY     endometriosis  . TONSILLECTOMY AND ADENOIDECTOMY      Family History  Problem Relation Age of Onset  . Anxiety disorder Mother   . Memory loss Mother   . Hyperlipidemia Mother   . Other Father        polio  . Lupus Brother   . Colon cancer Paternal Grandfather   . Diabetes Paternal Grandfather   . Esophageal cancer Neg Hx   . Stomach cancer Neg Hx   . Rectal cancer Neg Hx     Allergies  Allergen Reactions  . Penicillins Anaphylaxis    REACTION: rash---red face  . Celexa [Citalopram Hydrobromide]     Tic as side  effect  . Trazodone And Nefazodone Other (See Comments)    Excess sedation  . Ceftin [Cefuroxime] Nausea Only  . Prozac [Fluoxetine Hcl] Hives    Can take paxil     Current Outpatient Medications on File Prior to Visit  Medication Sig Dispense Refill  . ALPRAZolam (XANAX XR) 0.5 MG 24 hr tablet TK 1 T PO  Q 8 H  0  . ALPRAZolam (XANAX) 0.5 MG tablet 1/2 to 1 po if needed for anxiety up to tid 30 tablet 2  . cetirizine (ZYRTEC) 10 MG tablet Take 10 mg by mouth daily.    . Ibuprofen (ADVIL) 200 MG CAPS Take by mouth. Four a day    . levothyroxine (SYNTHROID, LEVOTHROID) 25 MCG tablet TK 1 T PO D  6  . Loratadine-Pseudoephedrine (CLARITIN-D 24 HOUR PO) Take by mouth.    . phenazopyridine (PYRIDIUM) 200 MG tablet Take 1 tablet (200 mg total) by mouth 3 (three) times daily as needed. 15 tablet 0  . PRESCRIPTION MEDICATION valium vaginal suppositories 10 mg to be placed daily    . traMADol (ULTRAM) 50 MG tablet Take 50 mg by mouth at bedtime as needed.     No current facility-administered medications on file prior to visit.     BP 110/80   Temp 98.3 F (36.8 C)   Wt 155 lb (70.3 kg)   BMI 26.61 kg/m       Objective:   Physical Exam Vitals signs and nursing note reviewed.  Constitutional:      Appearance: She is ill-appearing.  HENT:     Right Ear: Ear canal and external ear normal. A middle ear effusion is present. There is no impacted cerumen. Tympanic membrane is bulging. Tympanic membrane is not erythematous or retracted.     Left Ear: Ear canal and external ear normal. A middle ear effusion is present. There is no impacted cerumen. Tympanic membrane is bulging. Tympanic membrane is not erythematous or retracted.     Nose: Mucosal edema present. No congestion or rhinorrhea.     Right Turbinates: Enlarged and swollen.     Left Turbinates: Enlarged and swollen.     Right Sinus: Maxillary sinus tenderness and frontal sinus tenderness present.     Left Sinus: Maxillary sinus  tenderness and frontal sinus tenderness present.     Mouth/Throat:     Mouth: Mucous membranes are moist.     Pharynx: Oropharynx is clear.  Eyes:     Extraocular  Movements: Extraocular movements intact.     Conjunctiva/sclera: Conjunctivae normal.     Pupils: Pupils are equal, round, and reactive to light.  Cardiovascular:     Rate and Rhythm: Normal rate and regular rhythm.     Pulses: Normal pulses.     Heart sounds: Normal heart sounds.  Pulmonary:     Effort: Pulmonary effort is normal.     Breath sounds: Normal breath sounds.  Musculoskeletal: Normal range of motion.  Skin:    General: Skin is warm and dry.     Capillary Refill: Capillary refill takes less than 2 seconds.  Neurological:     General: No focal deficit present.     Mental Status: She is alert.       Assessment & Plan:  1. Acute non-recurrent pansinusitis  - POC Influenza A&B(BINAX/QUICKVUE)- negative  - azithromycin (ZITHROMAX Z-PAK) 250 MG tablet; Take 2 tablets on Day 1.  Then take 1 tablet daily.  Dispense: 6 tablet; Refill: 0 - HYDROcodone-Chlorpheniramine 5-4 MG/5ML SOLN; Take 5 mLs by mouth at bedtime.  Dispense: 480 mL; Refill: 0  2. Cough  - HYDROcodone-Chlorpheniramine 5-4 MG/5ML SOLN; Take 5 mLs by mouth at bedtime.  Dispense: 480 mL; Refill: 0  Shirline Frees, NP

## 2018-05-19 ENCOUNTER — Encounter: Payer: Self-pay | Admitting: Adult Health

## 2018-05-21 ENCOUNTER — Encounter: Payer: Self-pay | Admitting: Adult Health

## 2018-05-22 ENCOUNTER — Other Ambulatory Visit: Payer: Self-pay | Admitting: Adult Health

## 2018-05-22 MED ORDER — HYDROCODONE-HOMATROPINE 5-1.5 MG/5ML PO SYRP: 5.0000 mL | ORAL_SOLUTION | Freq: Three times a day (TID) | ORAL | 0 refills | Status: DC | PRN

## 2018-07-06 ENCOUNTER — Encounter: Payer: Self-pay | Admitting: Adult Health

## 2018-08-02 ENCOUNTER — Other Ambulatory Visit: Payer: Self-pay | Admitting: Obstetrics and Gynecology

## 2018-08-02 DIAGNOSIS — N6310 Unspecified lump in the right breast, unspecified quadrant: Principal | ICD-10-CM

## 2018-08-02 DIAGNOSIS — N6315 Unspecified lump in the right breast, overlapping quadrants: Secondary | ICD-10-CM

## 2018-08-14 ENCOUNTER — Other Ambulatory Visit: Payer: Managed Care, Other (non HMO)

## 2018-08-16 ENCOUNTER — Other Ambulatory Visit: Payer: Self-pay

## 2018-08-16 ENCOUNTER — Ambulatory Visit
Admission: RE | Admit: 2018-08-16 | Discharge: 2018-08-16 | Disposition: A | Discharge status: Home / Self Care | Payer: Managed Care, Other (non HMO) | Source: Ambulatory Visit | Attending: Obstetrics and Gynecology | Admitting: Obstetrics and Gynecology

## 2018-08-16 DIAGNOSIS — N6315 Unspecified lump in the right breast, overlapping quadrants: Secondary | ICD-10-CM

## 2018-10-01 ENCOUNTER — Encounter: Payer: Self-pay | Admitting: Adult Health

## 2018-10-02 ENCOUNTER — Encounter: Payer: Self-pay | Admitting: Adult Health

## 2018-10-02 ENCOUNTER — Ambulatory Visit (INDEPENDENT_AMBULATORY_CARE_PROVIDER_SITE_OTHER): Payer: Managed Care, Other (non HMO) | Admitting: Adult Health

## 2018-10-02 ENCOUNTER — Other Ambulatory Visit: Payer: Self-pay

## 2018-10-02 DIAGNOSIS — R519 Headache, unspecified: Secondary | ICD-10-CM

## 2018-10-02 DIAGNOSIS — R51 Headache: Secondary | ICD-10-CM

## 2018-10-02 DIAGNOSIS — R5383 Other fatigue: Secondary | ICD-10-CM | POA: Diagnosis not present

## 2018-10-02 NOTE — Progress Notes (Signed)
Virtual Visit via Video Note  I connected with Erica Saunders on 10/02/18 at  5:00 PM EDT by a video enabled telemedicine application and verified that I am speaking with the correct person using two identifiers.  Location patient: home Location provider:work or home office Persons participating in the virtual visit: patient, provider  I discussed the limitations of evaluation and management by telemedicine and the availability of in person appointments. The patient expressed understanding and agreed to proceed.   HPI: 48 year old female is being evaluated today for an acute issue.  Her symptoms started approximately 2 weeks ago with fatigue.  Associated symptoms include generalized body aches, frontal headache and rhinorrhea.  She denies fevers, chills, sore throat, shortness of breath, or wheezing.  She was tested for COVID 1 week ago and that test came back negative.  The weekend she slept for about 15 hours a day.  She has tried to do her usual exercise routine of yoga and running but was unable to make it through a complete yoga class due to fatigue.  She has been taking Zyrtec daily for seasonal allergies, this is her usual routine.  She is also using Advil every 4 hours for the headache.  Denies loss of appetite and she is staying hydrated.    ROS: See pertinent positives and negatives per HPI.  Past Medical History:  Diagnosis Date  . ALLERGIC RHINITIS CAUSE UNSPECIFIED 04/09/2008  . ANA positive   . Anxiety   . ANXIETY DISORDER, GENERALIZED 10/10/2006   hospitalized  about age 4 after child  wih panic episode  . DDD (degenerative disc disease), lumbar 01/26/2016  . Erythromelalgia ([place]) 04/22/2009  . Headache 01/26/2016  . History of miscarriage    x 2   . Hx of endometriosis   . 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 Irritable bowel syndrome 10/10/2006  . Raynaud's disease 01/26/2016    Past Surgical History:  Procedure Laterality Date  . ABDOMINAL HYSTERECTOMY    .  APPENDECTOMY    . CESAREAN SECTION    . CHOLECYSTECTOMY    . DILATION AND CURETTAGE OF UTERUS    . LAPAROSCOPY     endometriosis  . TONSILLECTOMY AND ADENOIDECTOMY      Family History  Problem Relation Age of Onset  . Anxiety disorder Mother   . Memory loss Mother   . Hyperlipidemia Mother   . Other Father        polio  . Lupus Brother   . Colon cancer Paternal Grandfather   . Diabetes Paternal Grandfather   . Esophageal cancer Neg Hx   . Stomach cancer Neg Hx   . Rectal cancer Neg Hx      Current Outpatient Medications:  .  ALPRAZolam (XANAX XR) 0.5 MG 24 hr tablet, TK 1 T PO  Q 8 H, Disp: , Rfl: 0 .  ALPRAZolam (XANAX) 0.5 MG tablet, 1/2 to 1 po if needed for anxiety up to tid, Disp: 30 tablet, Rfl: 2 .  azithromycin (ZITHROMAX Z-PAK) 250 MG tablet, Take 2 tablets on Day 1.  Then take 1 tablet daily., Disp: 6 tablet, Rfl: 0 .  buPROPion (WELLBUTRIN XL) 150 MG 24 hr tablet, TK 1 T PO QAM FOR 1 WK THEN TK 2 AM, Disp: , Rfl:  .  cetirizine (ZYRTEC) 10 MG tablet, Take 10 mg by mouth daily., Disp: , Rfl:  .  HYDROcodone-homatropine (HYCODAN) 5-1.5 MG/5ML syrup, Take 5 mLs by mouth every 8 (eight) hours as needed for cough., Disp: 120 mL,  Rfl: 0 .  Ibuprofen (ADVIL) 200 MG CAPS, Take by mouth. Four a day, Disp: , Rfl:  .  levothyroxine (SYNTHROID, LEVOTHROID) 25 MCG tablet, TK 1 T PO D, Disp: , Rfl: 6 .  Loratadine-Pseudoephedrine (CLARITIN-D 24 HOUR PO), Take by mouth., Disp: , Rfl:  .  phenazopyridine (PYRIDIUM) 200 MG tablet, Take 1 tablet (200 mg total) by mouth 3 (three) times daily as needed., Disp: 15 tablet, Rfl: 0 .  PRESCRIPTION MEDICATION, valium vaginal suppositories 10 mg to be placed daily, Disp: , Rfl:   EXAM:  VITALS per patient if applicable:  GENERAL: alert, oriented, appears well and in no acute distress  HEENT: atraumatic, conjunttiva clear, no obvious abnormalities on inspection of external nose and ears  NECK: normal movements of the head and  neck  LUNGS: on inspection no signs of respiratory distress, breathing rate appears normal, no obvious gross SOB, gasping or wheezing  CV: no obvious cyanosis  MS: moves all visible extremities without noticeable abnormality  PSYCH/NEURO: pleasant and cooperative, no obvious depression or anxiety, speech and thought processing grossly intact  ASSESSMENT AND PLAN:  Discussed the following assessment and plan:  1. Fatigue, unspecified type - Unknown cause. Likely viral. Will get lab work and send for Covid testing.  - CBC with Differential/Platelet; Future - CMP; Future - TSH; Future - C-reactive Protein; Future - Sedimentation Rate; Future - DG Chest 2 View; Future  2. Acute nonintractable headache, unspecified headache type - Continue with Advil      I discussed the assessment and treatment plan with the patient. The patient was provided an opportunity to ask questions and all were answered. The patient agreed with the plan and demonstrated an understanding of the instructions.   The patient was advised to call back or seek an in-person evaluation if the symptoms worsen or if the condition fails to improve as anticipated.   Shirline Freesory Zaelyn Barbary, NP

## 2018-10-03 ENCOUNTER — Other Ambulatory Visit (INDEPENDENT_AMBULATORY_CARE_PROVIDER_SITE_OTHER): Payer: Managed Care, Other (non HMO)

## 2018-10-03 ENCOUNTER — Encounter: Payer: Self-pay | Admitting: Adult Health

## 2018-10-03 ENCOUNTER — Ambulatory Visit (INDEPENDENT_AMBULATORY_CARE_PROVIDER_SITE_OTHER): Payer: Managed Care, Other (non HMO)

## 2018-10-03 DIAGNOSIS — R5383 Other fatigue: Secondary | ICD-10-CM | POA: Diagnosis not present

## 2018-10-04 ENCOUNTER — Other Ambulatory Visit: Payer: Self-pay

## 2018-10-04 ENCOUNTER — Telehealth: Payer: Self-pay | Admitting: *Deleted

## 2018-10-04 DIAGNOSIS — Z20822 Contact with and (suspected) exposure to covid-19: Secondary | ICD-10-CM

## 2018-10-04 LAB — COMPREHENSIVE METABOLIC PANEL
ALT: 14 U/L (ref 0–35)
AST: 18 U/L (ref 0–37)
Albumin: 4.5 g/dL (ref 3.5–5.2)
Alkaline Phosphatase: 62 U/L (ref 39–117)
BUN: 15 mg/dL (ref 6–23)
CO2: 29 mEq/L (ref 19–32)
Calcium: 9.5 mg/dL (ref 8.4–10.5)
Chloride: 102 mEq/L (ref 96–112)
Creatinine, Ser: 0.99 mg/dL (ref 0.40–1.20)
GFR: 60.16 mL/min (ref >60.00)
Glucose, Bld: 85 mg/dL (ref 70–99)
Potassium: 4 mEq/L (ref 3.5–5.1)
Sodium: 139 mEq/L (ref 135–145)
Total Bilirubin: 0.6 mg/dL (ref 0.2–1.2)
Total Protein: 6.8 g/dL (ref 6.0–8.3)

## 2018-10-04 LAB — CBC WITH DIFFERENTIAL/PLATELET
Basophils Absolute: 0.1 10*3/uL (ref 0.0–0.1)
Basophils Relative: 1.5 % (ref 0.0–3.0)
Eosinophils Absolute: 0.1 10*3/uL (ref 0.0–0.7)
Eosinophils Relative: 1.3 % (ref 0.0–5.0)
HCT: 43.1 % (ref 36.0–46.0)
Hemoglobin: 14 g/dL (ref 12.0–15.0)
Lymphocytes Relative: 22.9 % (ref 12.0–46.0)
Lymphs Abs: 1.6 10*3/uL (ref 0.7–4.0)
MCHC: 32.5 g/dL (ref 30.0–36.0)
MCV: 90.1 fl (ref 78.0–100.0)
Monocytes Absolute: 0.5 10*3/uL (ref 0.1–1.0)
Monocytes Relative: 6.9 % (ref 3.0–12.0)
Neutro Abs: 4.8 10*3/uL (ref 1.4–7.7)
Neutrophils Relative %: 67.4 % (ref 43.0–77.0)
Platelets: 309 10*3/uL (ref 150.0–400.0)
RBC: 4.78 Mil/uL (ref 3.87–5.11)
RDW: 13.5 % (ref 11.5–15.5)
WBC: 7.2 10*3/uL (ref 4.0–10.5)

## 2018-10-04 LAB — C-REACTIVE PROTEIN: CRP: <1 mg/dL (ref 0.5–20.0)

## 2018-10-04 LAB — TSH: TSH: 1.79 u[IU]/mL (ref 0.35–4.50)

## 2018-10-04 LAB — SEDIMENTATION RATE: Sed Rate: 1 mm/hr (ref 0–20)

## 2018-10-04 NOTE — Telephone Encounter (Signed)
Pt called in and sch'd pt at the [facility] location today 7/9 at 3:15

## 2018-10-04 NOTE — Telephone Encounter (Signed)
covid testing Received: Yesterday Message Contents  Lamarr Lulas, CMA  [facility]        Good morning,   Tommi Rumps would like his patient to be tested for Covid:   Erica Saunders  [id number]  [id number]  Dx: fatigue   thanks     Pt called and left message to return call to schedule COVID-19 testing. Order placed

## 2018-10-05 ENCOUNTER — Other Ambulatory Visit: Payer: Self-pay | Admitting: Adult Health

## 2018-10-05 DIAGNOSIS — R519 Headache, unspecified: Secondary | ICD-10-CM

## 2018-10-05 MED ORDER — RIZATRIPTAN BENZOATE 5 MG PO TABS: 5.0000 mg | ORAL_TABLET | ORAL | 0 refills | Status: DC | PRN

## 2018-10-08 ENCOUNTER — Encounter: Payer: Self-pay | Admitting: Adult Health

## 2018-10-08 LAB — NOVEL CORONAVIRUS, NAA: SARS-CoV-2, NAA: NOT DETECTED

## 2018-10-15 ENCOUNTER — Encounter: Payer: Self-pay | Admitting: Adult Health

## 2018-11-27 ENCOUNTER — Telehealth: Payer: Managed Care, Other (non HMO) | Admitting: Adult Health

## 2018-11-27 ENCOUNTER — Encounter: Payer: Self-pay | Admitting: Adult Health

## 2018-11-27 NOTE — Telephone Encounter (Signed)
Pt now scheduled for virtual visit today at 5 PM

## 2018-12-12 ENCOUNTER — Other Ambulatory Visit: Payer: Self-pay

## 2018-12-12 DIAGNOSIS — Z20822 Contact with and (suspected) exposure to covid-19: Secondary | ICD-10-CM

## 2018-12-13 LAB — NOVEL CORONAVIRUS, NAA: SARS-CoV-2, NAA: NOT DETECTED

## 2018-12-17 ENCOUNTER — Encounter: Payer: Self-pay | Admitting: Adult Health

## 2018-12-18 ENCOUNTER — Other Ambulatory Visit: Payer: Self-pay

## 2018-12-18 ENCOUNTER — Telehealth (INDEPENDENT_AMBULATORY_CARE_PROVIDER_SITE_OTHER): Payer: Managed Care, Other (non HMO) | Admitting: Adult Health

## 2018-12-18 DIAGNOSIS — J0140 Acute pansinusitis, unspecified: Secondary | ICD-10-CM | POA: Diagnosis not present

## 2018-12-18 MED ORDER — HYDROCOD POLST-CPM POLST ER 10-8 MG/5ML PO SUER: 5.0000 mL | Freq: Every evening | ORAL | 0 refills | Status: DC | PRN

## 2018-12-18 MED ORDER — AZITHROMYCIN 250 MG PO TABS: ORAL_TABLET | ORAL | 0 refills | Status: DC

## 2018-12-18 NOTE — Progress Notes (Signed)
Virtual Visit via Video Note  I connected with Erica Saunders  on 12/18/18 at  3:30 PM EDT by a video enabled telemedicine application and verified that I am speaking with the correct person using two identifiers.  Location patient: home Location provider:work or home office Persons participating in the virtual visit: patient, provider  I discussed the limitations of evaluation and management by telemedicine and the availability of in person appointments. The patient expressed understanding and agreed to proceed.   HPI: 48 year old female who is being evaluated today for an acute issue.  Her symptoms started 7 to 10 days ago.  Her symptoms include sinus pain and pressure, bilateral ear pain that is worse on the right side, nasal congestion, postnasal drip, productive cough, and a mild sore throat.  Symptoms are worse in the evening.  Denies fever or chills.  Symptoms are not improving.  She has been tested for COVID twice, both resulted and negative.   ROS: See pertinent positives and negatives per HPI.  Past Medical History:  Diagnosis Date  . ALLERGIC RHINITIS CAUSE UNSPECIFIED 04/09/2008  . ANA positive   . Anxiety   . ANXIETY DISORDER, GENERALIZED 10/10/2006   hospitalized  about age 23 after child  wih panic episode  . DDD (degenerative disc disease), lumbar 01/26/2016  . Erythromelalgia (HCC) 04/22/2009  . Headache 01/26/2016  . History of miscarriage    x 2   . Hx of endometriosis   . 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 Irritable bowel syndrome 10/10/2006  . Raynaud's disease 01/26/2016    Past Surgical History:  Procedure Laterality Date  . ABDOMINAL HYSTERECTOMY    . APPENDECTOMY    . CESAREAN SECTION    . CHOLECYSTECTOMY    . DILATION AND CURETTAGE OF UTERUS    . LAPAROSCOPY     endometriosis  . TONSILLECTOMY AND ADENOIDECTOMY      Family History  Problem Relation Age of Onset  . Anxiety disorder Mother   . Memory loss Mother   . Hyperlipidemia Mother   .  Other Father        polio  . Lupus Brother   . Colon cancer Paternal Grandfather   . Diabetes Paternal Grandfather   . Esophageal cancer Neg Hx   . Stomach cancer Neg Hx   . Rectal cancer Neg Hx      Current Outpatient Medications:  .  ALPRAZolam (XANAX XR) 0.5 MG 24 hr tablet, TK 1 T PO  Q 8 H, Disp: , Rfl: 0 .  ALPRAZolam (XANAX) 0.5 MG tablet, 1/2 to 1 po if needed for anxiety up to tid, Disp: 30 tablet, Rfl: 2 .  azithromycin (ZITHROMAX Z-PAK) 250 MG tablet, Take 2 tablets on Day 1.  Then take 1 tablet daily., Disp: 6 tablet, Rfl: 0 .  buPROPion (WELLBUTRIN XL) 150 MG 24 hr tablet, TK 1 T PO QAM FOR 1 WK THEN TK 2 AM, Disp: , Rfl:  .  cetirizine (ZYRTEC) 10 MG tablet, Take 10 mg by mouth daily., Disp: , Rfl:  .  chlorpheniramine-HYDROcodone (TUSSIONEX PENNKINETIC ER) 10-8 MG/5ML SUER, Take 5 mLs by mouth at bedtime as needed for cough., Disp: 140 mL, Rfl: 0 .  HYDROcodone-homatropine (HYCODAN) 5-1.5 MG/5ML syrup, Take 5 mLs by mouth every 8 (eight) hours as needed for cough., Disp: 120 mL, Rfl: 0 .  Ibuprofen (ADVIL) 200 MG CAPS, Take by mouth. Four a day, Disp: , Rfl:  .  levothyroxine (SYNTHROID, LEVOTHROID) 25 MCG tablet, TK 1 T PO  D, Disp: , Rfl: 6 .  Loratadine-Pseudoephedrine (CLARITIN-D 24 HOUR PO), Take by mouth., Disp: , Rfl:  .  phenazopyridine (PYRIDIUM) 200 MG tablet, Take 1 tablet (200 mg total) by mouth 3 (three) times daily as needed., Disp: 15 tablet, Rfl: 0 .  PRESCRIPTION MEDICATION, valium vaginal suppositories 10 mg to be placed daily, Disp: , Rfl:  .  rizatriptan (MAXALT) 5 MG tablet, Take 1 tablet (5 mg total) by mouth as needed for migraine. May repeat in 2 hours if needed, Disp: 10 tablet, Rfl: 0  EXAM:  VITALS per patient if applicable:  GENERAL: alert, oriented, appears well and in no acute distress  HEENT: atraumatic, conjunttiva clear, no obvious abnormalities on inspection of external nose and ears  NECK: normal movements of the head and  neck  LUNGS: on inspection no signs of respiratory distress, breathing rate appears normal, no obvious gross SOB, gasping or wheezing  CV: no obvious cyanosis  MS: moves all visible extremities without noticeable abnormality  PSYCH/NEURO: pleasant and cooperative, no obvious depression or anxiety, speech and thought processing grossly intact  ASSESSMENT AND PLAN:  Discussed the following assessment and plan:  Acute non-recurrent pansinusitis - Plan: chlorpheniramine-HYDROcodone (TUSSIONEX PENNKINETIC ER) 10-8 MG/5ML SUER, azithromycin (ZITHROMAX Z-PAK) 250 MG tablet -We will treat due to symptoms.  She was advised to follow-up towards the end of the week if no improvement    I discussed the assessment and treatment plan with the patient. The patient was provided an opportunity to ask questions and all were answered. The patient agreed with the plan and demonstrated an understanding of the instructions.   The patient was advised to call back or seek an in-person evaluation if the symptoms worsen or if the condition fails to improve as anticipated.   Dorothyann Peng, NP

## 2018-12-18 NOTE — Telephone Encounter (Signed)
Pt has appt for virtual visit.  Nothing further needed.

## 2018-12-20 ENCOUNTER — Encounter: Payer: Self-pay | Admitting: Adult Health

## 2018-12-20 ENCOUNTER — Telehealth: Payer: Self-pay | Admitting: Family Medicine

## 2018-12-20 NOTE — Telephone Encounter (Signed)
I have scheduled pt for virtual visit at [place].  Will notify mother that all correspondence will need to be made by telephone until [place] can be secured.  Will forward to [facility] as [place].

## 2018-12-20 NOTE — Telephone Encounter (Signed)
[facility]  Phone Number: [phone]        Ilda Foil,   I sent a note through [facility] my chart for Erica Saunders's my chart since he hasn't been seen yet. He has a bad sore throat complaining that it feels like acid burning in his throat. He did have a very upset stomach for two days as well. He threw up in the middle of the night, so I am wondering if we can get him an appointment. He doesn't have Covid (negative test) but he has something that has now been carrying on for more than a week. It started with a fever for several days and a headache. What should we do? Do we have any options? I know he is not an established patient yet.   Thank you,  Herbie Baltimore

## 2018-12-26 ENCOUNTER — Encounter: Payer: Self-pay | Admitting: Family Medicine

## 2018-12-26 ENCOUNTER — Telehealth: Payer: Self-pay | Admitting: Family Medicine

## 2018-12-26 NOTE — Telephone Encounter (Signed)
Received PA request from the pharmacy.  Left a message for the pt to check her MyChart.  Sent her a message.  Need to see if she has pain out of pocket or if she is waiting on referral.

## 2018-12-27 NOTE — Telephone Encounter (Signed)
Pt paid out of pocket for medication.  PA is not required.  Will close note.

## 2019-01-09 ENCOUNTER — Encounter: Payer: Self-pay | Admitting: Adult Health

## 2019-01-10 ENCOUNTER — Other Ambulatory Visit: Payer: Self-pay

## 2019-01-10 ENCOUNTER — Telehealth (INDEPENDENT_AMBULATORY_CARE_PROVIDER_SITE_OTHER): Payer: Managed Care, Other (non HMO) | Admitting: Adult Health

## 2019-01-10 DIAGNOSIS — G43711 Chronic migraine without aura, intractable, with status migrainosus: Secondary | ICD-10-CM | POA: Diagnosis not present

## 2019-01-10 NOTE — Progress Notes (Signed)
Virtual Visit via Video Note  I connected with Erica Saunders  on 01/10/19 at  4:00 PM EDT by a video enabled telemedicine application and verified that I am speaking with the correct person using two identifiers.  Location patient: home Location provider:work or home office Persons participating in the virtual visit: patient, provider  I discussed the limitations of evaluation and management by telemedicine and the availability of in person appointments. The patient expressed understanding and agreed to proceed.   HPI: 48 year old female who is being evaluated today for right sided headache.  She was initially evaluated for this back in July at which time she was prescribed Maxalt.  Her labs came back negative including a CRP and sed rate.  She continues to have frequent almost daily right sided headache that presents above the right eye and radiates to the right temple.  Pain is not constant but only resolves when she takes Motrin.  Pain is described as "aching pressure"  she does endorse a watery eye at times.  Has noticed sensitivity to light and sound.  No nausea, vomiting, rhinorrhea.  She has not tried Maxalt yet due to reading side effect profile  ROS: See pertinent positives and negatives per HPI.  Past Medical History:  Diagnosis Date  . ALLERGIC RHINITIS CAUSE UNSPECIFIED 04/09/2008  . ANA positive   . Anxiety   . ANXIETY DISORDER, GENERALIZED 10/10/2006   hospitalized  about age 59 after child  wih panic episode  . DDD (degenerative disc disease), lumbar 01/26/2016  . Erythromelalgia (HCC) 04/22/2009  . Headache 01/26/2016  . History of miscarriage    x 2   . Hx of endometriosis   . 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 Irritable bowel syndrome 10/10/2006  . Raynaud's disease 01/26/2016    Past Surgical History:  Procedure Laterality Date  . ABDOMINAL HYSTERECTOMY    . APPENDECTOMY    . CESAREAN SECTION    . CHOLECYSTECTOMY    . DILATION AND CURETTAGE OF UTERUS    .  LAPAROSCOPY     endometriosis  . TONSILLECTOMY AND ADENOIDECTOMY      Family History  Problem Relation Age of Onset  . Anxiety disorder Mother   . Memory loss Mother   . Hyperlipidemia Mother   . Other Father        polio  . Lupus Brother   . Colon cancer Paternal Grandfather   . Diabetes Paternal Grandfather   . Esophageal cancer Neg Hx   . Stomach cancer Neg Hx   . Rectal cancer Neg Hx      Current Outpatient Medications:  .  ALPRAZolam (XANAX XR) 0.5 MG 24 hr tablet, TK 1 T PO  Q 8 H, Disp: , Rfl: 0 .  ALPRAZolam (XANAX) 0.5 MG tablet, 1/2 to 1 po if needed for anxiety up to tid, Disp: 30 tablet, Rfl: 2 .  azithromycin (ZITHROMAX Z-PAK) 250 MG tablet, Take 2 tablets on Day 1.  Then take 1 tablet daily., Disp: 6 tablet, Rfl: 0 .  buPROPion (WELLBUTRIN XL) 150 MG 24 hr tablet, TK 1 T PO QAM FOR 1 WK THEN TK 2 AM, Disp: , Rfl:  .  cetirizine (ZYRTEC) 10 MG tablet, Take 10 mg by mouth daily., Disp: , Rfl:  .  chlorpheniramine-HYDROcodone (TUSSIONEX PENNKINETIC ER) 10-8 MG/5ML SUER, Take 5 mLs by mouth at bedtime as needed for cough., Disp: 140 mL, Rfl: 0 .  Ibuprofen (ADVIL) 200 MG CAPS, Take by mouth. Four a day, Disp: , Rfl:  .  levothyroxine (SYNTHROID, LEVOTHROID) 25 MCG tablet, TK 1 T PO D, Disp: , Rfl: 6 .  Loratadine-Pseudoephedrine (CLARITIN-D 24 HOUR PO), Take by mouth., Disp: , Rfl:  .  phenazopyridine (PYRIDIUM) 200 MG tablet, Take 1 tablet (200 mg total) by mouth 3 (three) times daily as needed., Disp: 15 tablet, Rfl: 0 .  PRESCRIPTION MEDICATION, valium vaginal suppositories 10 mg to be placed daily, Disp: , Rfl:  .  rizatriptan (MAXALT) 5 MG tablet, Take 1 tablet (5 mg total) by mouth as needed for migraine. May repeat in 2 hours if needed, Disp: 10 tablet, Rfl: 0  EXAM:  VITALS per patient if applicable:  GENERAL: alert, oriented, appears well and in no acute distress  HEENT: atraumatic, conjunttiva clear, no obvious abnormalities on inspection of external nose  and ears  NECK: normal movements of the head and neck  LUNGS: on inspection no signs of respiratory distress, breathing rate appears normal, no obvious gross SOB, gasping or wheezing  CV: no obvious cyanosis  MS: moves all visible extremities without noticeable abnormality  PSYCH/NEURO: pleasant and cooperative, no obvious depression or anxiety, speech and thought processing grossly intact  ASSESSMENT AND PLAN:  Discussed the following assessment and plan:  1. Intractable chronic migraine without aura and with status migrainosus -She will trial Maxalt.  In the past we did try to get an MRI but insurance would not cover it.  She will follow-up with Maxalt does not resolve her migraine headaches    I discussed the assessment and treatment plan with the patient. The patient was provided an opportunity to ask questions and all were answered. The patient agreed with the plan and demonstrated an understanding of the instructions.   The patient was advised to call back or seek an in-person evaluation if the symptoms worsen or if the condition fails to improve as anticipated.   Dorothyann Peng, NP

## 2019-01-22 ENCOUNTER — Encounter: Payer: Self-pay | Admitting: Adult Health

## 2019-01-29 ENCOUNTER — Encounter: Payer: Self-pay | Admitting: Adult Health

## 2019-01-29 ENCOUNTER — Other Ambulatory Visit: Payer: Self-pay | Admitting: Adult Health

## 2019-01-29 MED ORDER — AZITHROMYCIN 250 MG PO TABS: ORAL_TABLET | ORAL | 0 refills | Status: DC

## 2019-01-31 ENCOUNTER — Other Ambulatory Visit: Payer: Self-pay

## 2019-01-31 DIAGNOSIS — Z20822 Contact with and (suspected) exposure to covid-19: Secondary | ICD-10-CM

## 2019-02-02 LAB — NOVEL CORONAVIRUS, NAA: SARS-CoV-2, NAA: DETECTED — AB

## 2019-02-05 ENCOUNTER — Encounter: Payer: Self-pay | Admitting: Adult Health

## 2019-05-09 ENCOUNTER — Encounter: Payer: Self-pay | Admitting: Adult Health

## 2019-05-10 ENCOUNTER — Other Ambulatory Visit: Payer: Self-pay | Admitting: Adult Health

## 2019-05-10 MED ORDER — RIZATRIPTAN BENZOATE 5 MG PO TABS: 5.0000 mg | ORAL_TABLET | ORAL | 3 refills | Status: DC | PRN

## 2019-05-10 NOTE — Telephone Encounter (Signed)
Please advise 

## 2019-06-04 ENCOUNTER — Encounter: Payer: Self-pay | Admitting: Adult Health

## 2019-06-10 ENCOUNTER — Ambulatory Visit: Payer: Managed Care, Other (non HMO) | Admitting: Neurology

## 2019-07-08 ENCOUNTER — Encounter: Payer: Self-pay | Admitting: Adult Health

## 2019-07-24 ENCOUNTER — Encounter: Payer: Self-pay | Admitting: Neurology

## 2019-07-24 ENCOUNTER — Ambulatory Visit: Payer: Managed Care, Other (non HMO) | Admitting: Neurology

## 2019-07-24 ENCOUNTER — Other Ambulatory Visit: Payer: Self-pay

## 2019-07-24 DIAGNOSIS — G4489 Other headache syndrome: Secondary | ICD-10-CM

## 2019-07-24 DIAGNOSIS — G43019 Migraine without aura, intractable, without status migrainosus: Secondary | ICD-10-CM

## 2019-07-24 HISTORY — DX: Migraine without aura, intractable, without status migrainosus: G43.019

## 2019-07-24 MED ORDER — TOPIRAMATE 25 MG PO TABS
ORAL_TABLET | ORAL | 3 refills | Status: DC
Start: 2019-07-24 — End: 2020-02-03

## 2019-07-24 MED ORDER — RIZATRIPTAN BENZOATE 10 MG PO TABS
10.0000 mg | ORAL_TABLET | Freq: Three times a day (TID) | ORAL | 3 refills | Status: DC | PRN
Start: 2019-07-24 — End: 2020-02-03

## 2019-07-24 NOTE — Progress Notes (Signed)
Reason for visit: Headaches, cognitive changes  Referring physician: Dr. George Ina is a 49 y.o. female  History of present illness:  Erica Saunders is a 49 year old right-handed white female with a longstanding history of irritable bowel syndrome, interstitial cystitis, and rheumatologic issues that include Raynaud's phenomenon and erythromelalgia affecting the feet.  The patient is followed through rheumatology.  The patient was seen through this office in 2014 by Dr. Hosie Poisson with reports of "brain zaps" in the left occipital area and right-sided numbness.  The patient underwent MRI of the brain at that time which was unremarkable.  The patient claims that her brother has headaches as well, but he also has the diagnosis of multiple sclerosis.  The patient indicates that she does get headaches that are mainly in the bitemporal and frontal areas that may occur once or twice a week.  She notes that she will get photophobia and phonophobia with the headache, she may have nausea without vomiting, she notes that sleep will help the headache.  She indicates that her brain zaps still occur, and may be in the right occipital area or in the frontal areas, they migrate about.  The patient reports some generalized cognitive slowing, she sometimes has difficulty getting her brain to tell her body what to do, she feels that her right hand is more of a problem than the left in this regard.  She also reports some neuromuscular pain in the low back and in the thighs, she has not been formally diagnosed with fibromyalgia however.  She remains active, she does yoga, she will run, she does biking.  The patient reports some hip pain, left greater than right.  She is sent to this office for an evaluation.  She had Covid virus infection in November 2020, she believes that many of the symptoms described above worsened, but they certainly did not start at the time of the virus infection.  Past Medical History:   Diagnosis Date  . ALLERGIC RHINITIS CAUSE UNSPECIFIED 04/09/2008  . ANA positive   . Anxiety   . ANXIETY DISORDER, GENERALIZED 10/10/2006   hospitalized  about age 39 after child  wih panic episode  . DDD (degenerative disc disease), lumbar 01/26/2016  . Erythromelalgia (HCC) 04/22/2009  . Erythromelia   . Headache 01/26/2016  . History of miscarriage    x 2   . Hx of endometriosis   . 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 Irritable bowel syndrome 10/10/2006  . Raynaud's disease 01/26/2016    Past Surgical History:  Procedure Laterality Date  . ABDOMINAL HYSTERECTOMY    . APPENDECTOMY    . CESAREAN SECTION    . CHOLECYSTECTOMY    . DILATION AND CURETTAGE OF UTERUS    . LAPAROSCOPY     endometriosis  . TONSILLECTOMY AND ADENOIDECTOMY      Family History  Problem Relation Age of Onset  . Anxiety disorder Mother   . Memory loss Mother   . Hyperlipidemia Mother   . Other Father        polio  . Lupus Brother   . Colon cancer Paternal Grandfather   . Diabetes Paternal Grandfather   . Esophageal cancer Neg Hx   . Stomach cancer Neg Hx   . Rectal cancer Neg Hx     Social history:  reports that she has never smoked. She has never used smokeless tobacco. She reports current alcohol use of about 5.0 standard drinks of alcohol per week. She reports that she  does not use drugs.  Medications:  Prior to Admission medications   Medication Sig Start Date End Date Taking? Authorizing Provider  ALPRAZolam (XANAX XR) 0.5 MG 24 hr tablet Take by mouth daily.  07/14/16  Yes [provider]  ALPRAZolam Duanne Moron) 0.5 MG tablet 1/2 to 1 po if needed for anxiety up to tid 08/20/13  Yes Panosh, Standley Brooking, MD  BIOTIN PO Take by mouth.   Yes [provider]  buPROPion (WELLBUTRIN XL) 150 MG 24 hr tablet 150 mg daily.  03/05/18  Yes [provider]  cetirizine (ZYRTEC) 10 MG tablet Take 10 mg by mouth daily.   Yes [provider]  Ibuprofen (ADVIL) 200 MG CAPS Take by  mouth. Four a day   Yes [provider]  levothyroxine (SYNTHROID, LEVOTHROID) 25 MCG tablet Take 25 mcg by mouth daily before breakfast.  08/26/16  Yes [provider]  linaclotide (LINZESS) 145 MCG CAPS capsule Linzess 145 mcg capsule   Yes [provider]  Magnesium 200 MG TABS Take by mouth.   Yes [provider]  OMEPRAZOLE PO Take by mouth.   Yes [provider]  phenazopyridine (PYRIDIUM) 200 MG tablet Take 1 tablet (200 mg total) by mouth 3 (three) times daily as needed. 01/14/13  Yes Panosh, Standley Brooking, MD  PRESCRIPTION MEDICATION valium vaginal suppositories 10 mg to be placed daily 07/05/16  Yes [provider]  promethazine (PHENERGAN) 25 MG tablet Take 25 mg by mouth every 4 (four) hours. 03/13/19  Yes [provider]  rizatriptan (MAXALT) 5 MG tablet Take 1 tablet (5 mg total) by mouth as needed for migraine. May repeat in 2 hours if needed 05/10/19  Yes Nafziger, Tommi Rumps, NP  spironolactone (ALDACTONE) 50 MG tablet spironolactone 50 mg tablet  Take 1 tablet every day by oral route.   Yes [provider]      Allergies  Allergen Reactions  . Penicillins Anaphylaxis    REACTION: rash---red face  . Celexa [Citalopram Hydrobromide]     Tic as side effect  . Trazodone And Nefazodone Other (See Comments)    Excess sedation  . Ceftin [Cefuroxime] Nausea Only  . Prozac [Fluoxetine Hcl] Hives    Can take paxil     ROS:  Out of a complete 14 system review of symptoms, the patient complains only of the following symptoms, and all other reviewed systems are negative.  Irritable bowel Difficulty voiding bladder Headache Difficulty with concentration Muscle pain of the legs  Blood pressure 120/88, pulse 92, temperature (!) 97.1 F (36.2 C), height 5\' 4"  (1.626 m), weight 159 lb (72.1 kg).  Physical Exam  General: The patient is alert and cooperative at the time of the examination.  Eyes: Pupils are equal,  round, and reactive to light. Discs are flat bilaterally.  Neck: The neck is supple, no carotid bruits are noted.  Respiratory: The respiratory examination is clear.  Cardiovascular: The cardiovascular examination reveals a regular rate and rhythm, no obvious murmurs or rubs are noted.  Neuromuscular: Range move the cervical spine is full.  Skin: Extremities are without significant edema.  Neurologic Exam  Mental status: The patient is alert and oriented x 3 at the time of the examination. The patient has apparent normal recent and remote memory, with an apparently normal attention span and concentration ability.  Cranial nerves: Facial symmetry is present. There is good sensation of the face to pinprick and soft touch bilaterally. The strength of the facial muscles and  the muscles to head turning and shoulder shrug are normal bilaterally. Speech is well enunciated, no aphasia or dysarthria is noted. Extraocular movements are full. Visual fields are full. The tongue is midline, and the patient has symmetric elevation of the soft palate. No obvious hearing deficits are noted.  Motor: The motor testing reveals 5 over 5 strength of all 4 extremities. Good symmetric motor tone is noted throughout.  Sensory: Sensory testing is intact to pinprick, soft touch, vibration sensation, and position sense on all 4 extremities. No evidence of extinction is noted.  Coordination: Cerebellar testing reveals good finger-nose-finger and heel-to-shin bilaterally.  Gait and station: Gait is normal. Tandem gait is normal. Romberg is negative. No drift is seen.  Reflexes: Deep tendon reflexes are symmetric and normal bilaterally. Toes are downgoing bilaterally.   Assessment/Plan:  1.  Common migraine headache, intractable  2.  Irritable bowel syndrome  3.  Interstitial cystitis  4.  Erythromelalgia  The patient likely has migraine headaches associated with "ice pick pains" which she describes as  brain zaps.  The patient was evaluated in 2014 for sensory alteration, brain zaps, and some cognitive slowing.  The patient is concerned that she has multiple sclerosis, but I do not believe that this is likely.  We will check MRI of the brain.  The patient was placed on Topamax for her headache and she will be continued on her Maxalt.  The patient will follow-up here in about 4 months.  Marlan Palau MD 07/24/2019 8:34 AM  [facility] [address] [place], [place] [address]  Phone [phone] Fax [phone]

## 2019-07-24 NOTE — Patient Instructions (Signed)
We will start Topamax for headache prevention.  Topamax (topiramate) is a seizure medication that has an FDA approval for seizures and for migraine headache. Potential side effects of this medication include weight loss, cognitive slowing, tingling in the fingers and toes, and carbonated drinks will taste bad. If any significant side effects are noted on this drug, please contact our office.  

## 2019-08-30 ENCOUNTER — Other Ambulatory Visit: Payer: Managed Care, Other (non HMO)

## 2019-09-19 ENCOUNTER — Ambulatory Visit: Payer: Managed Care, Other (non HMO) | Admitting: Orthopaedic Surgery

## 2019-09-24 ENCOUNTER — Ambulatory Visit
Admission: RE | Admit: 2019-09-24 | Discharge: 2019-09-24 | Disposition: A | Discharge status: Home / Self Care | Payer: Managed Care, Other (non HMO) | Source: Ambulatory Visit | Attending: Neurology | Admitting: Neurology

## 2019-09-24 ENCOUNTER — Other Ambulatory Visit: Payer: Self-pay

## 2019-09-24 DIAGNOSIS — G4489 Other headache syndrome: Secondary | ICD-10-CM | POA: Diagnosis not present

## 2019-09-24 MED ORDER — GADOBENATE DIMEGLUMINE 529 MG/ML IV SOLN
14.0000 mL | Freq: Once | INTRAVENOUS | Status: AC | PRN
  Administered 2019-09-24: 14 mL via INTRAVENOUS

## 2019-09-26 ENCOUNTER — Telehealth: Payer: Self-pay | Admitting: Neurology

## 2019-09-26 NOTE — Telephone Encounter (Signed)
I called the patient.  MRI of the brain shows nonspecific mild white matter changes, I cannot see the actual images from MRI of the brain from 2014 but this issue was described on that report as well.  This is a likely change in the brain related to the history of migraine.  I discussed this with the patient.    MRI brain 09/25/19:  IMPRESSION:   MRI brain (with and without) demonstrating: - Scattered periventricular and subcortical foci of non-specific gliosis. No abnormal lesions on post-contrast views.  - No acute findings.

## 2019-11-19 ENCOUNTER — Ambulatory Visit: Payer: Managed Care, Other (non HMO) | Admitting: Neurology

## 2020-01-10 ENCOUNTER — Other Ambulatory Visit: Payer: Self-pay

## 2020-01-10 ENCOUNTER — Ambulatory Visit (INDEPENDENT_AMBULATORY_CARE_PROVIDER_SITE_OTHER): Payer: Managed Care, Other (non HMO) | Admitting: *Deleted

## 2020-01-10 DIAGNOSIS — Z23 Encounter for immunization: Secondary | ICD-10-CM | POA: Diagnosis not present

## 2020-01-13 ENCOUNTER — Encounter: Payer: Self-pay | Admitting: Adult Health

## 2020-02-01 ENCOUNTER — Other Ambulatory Visit: Payer: Self-pay | Admitting: Neurology

## 2020-02-03 ENCOUNTER — Encounter: Payer: Self-pay | Admitting: Neurology

## 2020-02-03 ENCOUNTER — Ambulatory Visit: Payer: Managed Care, Other (non HMO) | Admitting: Neurology

## 2020-02-03 ENCOUNTER — Other Ambulatory Visit: Payer: Self-pay

## 2020-02-03 VITALS — BP 114/76 | HR 95 | Ht 64.0 in | Wt 164.2 lb

## 2020-02-03 DIAGNOSIS — G43019 Migraine without aura, intractable, without status migrainosus: Secondary | ICD-10-CM | POA: Diagnosis not present

## 2020-02-03 MED ORDER — SUMATRIPTAN SUCCINATE 100 MG PO TABS
100.0000 mg | ORAL_TABLET | ORAL | 11 refills | Status: DC | PRN
Start: 2020-02-03 — End: 2020-04-01

## 2020-02-03 MED ORDER — TOPIRAMATE 25 MG PO TABS: ORAL_TABLET | ORAL | 3 refills | Status: DC

## 2020-02-03 NOTE — Patient Instructions (Signed)
Try the Topamax for headache prevention  Try Imitrex as needed for acute headache  Send you for eye exam  See you back in 6 months

## 2020-02-03 NOTE — Progress Notes (Signed)
PATIENT: Barry Dienes DOB: 1970-09-17  REASON FOR VISIT: follow up HISTORY FROM: patient  HISTORY OF PRESENT ILLNESS: Today 02/03/20 Everlyn Farabaugh is a 49 year old female with history of IBS, interstitial cystitis, several rheumatologic issues.  Was seen back in April for "brain zaps", associated with ice pick pains likely related to migraine headaches.  MRI of the brain in July 2021 showed nonspecific mild white matter changes, likely related to history of migraine. Was placed on Topamax, never started, was afraid of side effects.  On average, 3 migraines a week, is gainfully employed full-time, will have to come home at the end of the day, lay down in the bed.  Takes Maxalt, not sure it is actually helpful, because she just falls asleep, was doing this before.  Reports recurrent sores in her eyes, like styes.  Think she needs eye exam, eye strain triggering migraines.  She is active, does hot yoga, has a Peloton bike.  If she does anything too strenuous, she feels sore.  Has severe anxiety, on XR Xanax, depression is well controlled with Wellbutrin.  She does have some cognitive slowing at times.  Presents today for evaluation unaccompanied.  HISTORY 07/24/2019 Dr. Jannifer Franklin: Ms. Bainter is a 49 year old right-handed white female with a longstanding history of irritable bowel syndrome, interstitial cystitis, and rheumatologic issues that include Raynaud's phenomenon and erythromelalgia affecting the feet.  The patient is followed through rheumatology.  The patient was seen through this office in 2014 by Dr. Janann Colonel with reports of "brain zaps" in the left occipital area and right-sided numbness.  The patient underwent MRI of the brain at that time which was unremarkable.  The patient claims that her brother has headaches as well, but he also has the diagnosis of multiple sclerosis.  The patient indicates that she does get headaches that are mainly in the bitemporal and frontal areas that may  occur once or twice a week.  She notes that she will get photophobia and phonophobia with the headache, she may have nausea without vomiting, she notes that sleep will help the headache.  She indicates that her brain zaps still occur, and may be in the right occipital area or in the frontal areas, they migrate about.  The patient reports some generalized cognitive slowing, she sometimes has difficulty getting her brain to tell her body what to do, she feels that her right hand is more of a problem than the left in this regard.  She also reports some neuromuscular pain in the low back and in the thighs, she has not been formally diagnosed with fibromyalgia however.  She remains active, she does yoga, she will run, she does biking.  The patient reports some hip pain, left greater than right.  She is sent to this office for an evaluation.  She had Covid virus infection in November 2020, she believes that many of the symptoms described above worsened, but they certainly did not start at the time of the virus infection.   REVIEW OF SYSTEMS: Out of a complete 14 system review of symptoms, the patient complains only of the following symptoms, and all other reviewed systems are negative.  Headache  ALLERGIES: Allergies  Allergen Reactions  . Penicillins Anaphylaxis    REACTION: rash---red face  . Celexa [Citalopram Hydrobromide]     Tic as side effect  . Trazodone And Nefazodone Other (See Comments)    Excess sedation  . Ceftin [Cefuroxime] Nausea Only  . Prozac [Fluoxetine Hcl] Hives  Can take paxil     HOME MEDICATIONS: Outpatient Medications Prior to Visit  Medication Sig Dispense Refill  . ALPRAZolam (XANAX XR) 0.5 MG 24 hr tablet Take by mouth daily.   0  . ALPRAZolam (XANAX) 0.5 MG tablet 1/2 to 1 po if needed for anxiety up to tid 30 tablet 2  . BIOTIN PO Take by mouth.    Marland Kitchen buPROPion (WELLBUTRIN XL) 150 MG 24 hr tablet 150 mg daily.     . cetirizine (ZYRTEC) 10 MG tablet Take 10 mg by  mouth daily.    . Ibuprofen (ADVIL) 200 MG CAPS Take by mouth. Four a day    . levothyroxine (SYNTHROID, LEVOTHROID) 25 MCG tablet Take 25 mcg by mouth daily before breakfast.   6  . linaclotide (LINZESS) 145 MCG CAPS capsule Linzess 145 mcg capsule    . Magnesium 200 MG TABS Take by mouth.    . OMEPRAZOLE PO Take by mouth.    . phenazopyridine (PYRIDIUM) 200 MG tablet Take 1 tablet (200 mg total) by mouth 3 (three) times daily as needed. 15 tablet 0  . PRESCRIPTION MEDICATION valium vaginal suppositories 10 mg to be placed daily    . promethazine (PHENERGAN) 25 MG tablet Take 25 mg by mouth every 4 (four) hours.    Marland Kitchen spironolactone (ALDACTONE) 50 MG tablet spironolactone 50 mg tablet  Take 1 tablet every day by oral route.    . rizatriptan (MAXALT) 10 MG tablet Take 1 tablet (10 mg total) by mouth 3 (three) times daily as needed for migraine. 10 tablet 3  . topiramate (TOPAMAX) 25 MG tablet Take one tablet at night for one week, then take 2 tablets at night for one week, then take 3 tablets at night. 90 tablet 3   No facility-administered medications prior to visit.    PAST MEDICAL HISTORY: Past Medical History:  Diagnosis Date  . ALLERGIC RHINITIS CAUSE UNSPECIFIED 04/09/2008  . ANA positive   . Anxiety   . ANXIETY DISORDER, GENERALIZED 10/10/2006   hospitalized  about age 10 after child  wih panic episode  . Common migraine with intractable migraine 07/24/2019  . DDD (degenerative disc disease), lumbar 01/26/2016  . Erythromelalgia ([place]) 04/22/2009  . Erythromelia   . Headache 01/26/2016  . History of miscarriage    x 2   . Hx of endometriosis   . 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 Irritable bowel syndrome 10/10/2006  . Raynaud's disease 01/26/2016    PAST SURGICAL HISTORY: Past Surgical History:  Procedure Laterality Date  . ABDOMINAL HYSTERECTOMY    . APPENDECTOMY    . CESAREAN SECTION    . CHOLECYSTECTOMY    . DILATION AND CURETTAGE OF UTERUS    . LAPAROSCOPY      endometriosis  . TONSILLECTOMY AND ADENOIDECTOMY      FAMILY HISTORY: Family History  Problem Relation Age of Onset  . Anxiety disorder Mother   . Memory loss Mother   . Hyperlipidemia Mother   . Other Father        polio  . Lupus Brother   . Colon cancer Paternal Grandfather   . Diabetes Paternal Grandfather   . Esophageal cancer Neg Hx   . Stomach cancer Neg Hx   . Rectal cancer Neg Hx     SOCIAL HISTORY: Social History   Socioeconomic History  . Marital status: Married    Spouse name: Elta Guadeloupe  . Number of children: 1  . Years of education: 3  . Highest education  level: Not on file  Occupational History    Employer: [employer]  Tobacco Use  . Smoking status: Never Smoker  . Smokeless tobacco: Never Used  Vaping Use  . Vaping Use: Never used  Substance and Sexual Activity  . Alcohol use: Yes    Alcohol/week: 5.0 standard drinks    Types: 5 Glasses of wine per week  . Drug use: No  . Sexual activity: Not on file  Other Topics Concern  . Not on file  Social History Narrative   Patient is married Public relations account executive)   Wine at night max 1    Neg td    caffiene in am    Employed.    MSW    Patient has one child.   Patient works full-time, Programmer, applications.   Patient has her Masters.   Social Determinants of Health   Financial Resource Strain:   . Difficulty of Paying Living Expenses: Not on file  Food Insecurity:   . Worried About Charity fundraiser in the Last Year: Not on file  . Ran Out of Food in the Last Year: Not on file  Transportation Needs:   . Lack of Transportation (Medical): Not on file  . Lack of Transportation (Non-Medical): Not on file  Physical Activity:   . Days of Exercise per Week: Not on file  . Minutes of Exercise per Session: Not on file  Stress:   . Feeling of Stress : Not on file  Social Connections:   . Frequency of Communication with Friends and Family: Not on file  . Frequency of Social Gatherings with Friends and Family:  Not on file  . Attends Religious Services: Not on file  . Active Member of Clubs or Organizations: Not on file  . Attends Archivist Meetings: Not on file  . Marital Status: Not on file  Intimate Partner Violence:   . Fear of Current or Ex-Partner: Not on file  . Emotionally Abused: Not on file  . Physically Abused: Not on file  . Sexually Abused: Not on file   PHYSICAL EXAM  Vitals:   02/03/20 1507  BP: 114/76  Pulse: 95  Weight: 164 lb 3.2 oz (74.5 kg)  Height: 5' 4"  (1.626 m)   Body mass index is 28.18 kg/m.  Generalized: Well developed, in no acute distress   Neurological examination  Mentation: Alert oriented to time, place, history taking. Follows all commands speech and language fluent Cranial nerve II-XII: Pupils were equal round reactive to light. Extraocular movements were full, visual field were full on confrontational test. Facial sensation and strength were normal. Head turning and shoulder shrug  were normal and symmetric. Motor: The motor testing reveals 5 over 5 strength of all 4 extremities. Good symmetric motor tone is noted throughout.  Sensory: Sensory testing is intact to soft touch on all 4 extremities. No evidence of extinction is noted.  Coordination: Cerebellar testing reveals good finger-nose-finger and heel-to-shin bilaterally.  Gait and station: Gait is normal.  Reflexes: Deep tendon reflexes are symmetric and normal bilaterally.   DIAGNOSTIC DATA (LABS, IMAGING, TESTING) - I reviewed patient records, labs, notes, testing and imaging myself where available.  Lab Results  Component Value Date   WBC 7.2 10/03/2018   HGB 14.0 10/03/2018   HCT 43.1 10/03/2018   MCV 90.1 10/03/2018   PLT 309.0 10/03/2018      Component Value Date/Time   NA 139 10/03/2018 1528   NA 138 01/05/2016 0000   K  4.0 10/03/2018 1528   CL 102 10/03/2018 1528   CO2 29 10/03/2018 1528   GLUCOSE 85 10/03/2018 1528   BUN 15 10/03/2018 1528   BUN 13 01/05/2016  0000   CREATININE 0.99 10/03/2018 1528   CALCIUM 9.5 10/03/2018 1528   PROT 6.8 10/03/2018 1528   ALBUMIN 4.5 10/03/2018 1528   AST 18 10/03/2018 1528   ALT 14 10/03/2018 1528   ALKPHOS 62 10/03/2018 1528   BILITOT 0.6 10/03/2018 1528   GFRNONAA >60 12/31/2009 1000   GFRAA  12/31/2009 1000    >60        The eGFR has been calculated using the MDRD equation. This calculation has not been validated in all clinical situations. eGFR's persistently <60 mL/min signify possible Chronic Kidney Disease.   Lab Results  Component Value Date   CHOL 190 12/22/2016   HDL 93.30 12/22/2016   LDLCALC 87 12/22/2016   LDLDIRECT 90.2 02/08/2012   TRIG 48.0 12/22/2016   CHOLHDL 2 12/22/2016   Lab Results  Component Value Date   HGBA1C 5.1 12/22/2016   No results found for: VITAMINB12 Lab Results  Component Value Date   TSH 1.79 10/03/2018      ASSESSMENT AND PLAN 49 y.o. year old female  has a past medical history of ALLERGIC RHINITIS CAUSE UNSPECIFIED (04/09/2008), ANA positive, Anxiety, ANXIETY DISORDER, GENERALIZED (10/10/2006), Common migraine with intractable migraine (07/24/2019), DDD (degenerative disc disease), lumbar (01/26/2016), Erythromelalgia ([place]) (04/22/2009), Erythromelia, Headache (01/26/2016), History of miscarriage, endometriosis, INTERSTITIAL CYSTITIS (10/10/2006), Irritable bowel syndrome (10/10/2006), and Raynaud's disease (01/26/2016). here with:  1.  Chronic migraine headache 2.  IBS 3.  Interstitial cystitis  -Try Topamax as prescribed before working up to 75 mg at bedtime for migraine prevention -If Topamax is not helpful or tolerated, will try propanolol, then eventual CGRP -Stop Maxalt, lack of benefit, try Imitrex 100 mg as needed for acute migraine -Call for dose adjustment, follow-up in 6 months or sooner if needed  I spent 20 minutes of face-to-face and non-face-to-face time with patient.  This included previsit chart review, lab review, study review, order  entry, electronic health record documentation, patient education.  Butler Denmark, AGNP-C, DNP 02/03/2020, 4:24 PM [facility] [address] [place], [place] [address] [phone]

## 2020-02-03 NOTE — Progress Notes (Signed)
I have read the note, and I agree with the clinical assessment and plan.  Yanna Leaks K Tyler Robidoux   

## 2020-02-26 ENCOUNTER — Telehealth: Payer: Self-pay | Admitting: Neurology

## 2020-02-26 MED ORDER — RIZATRIPTAN BENZOATE 10 MG PO TBDP
10.0000 mg | ORAL_TABLET | ORAL | 11 refills | Status: DC | PRN
Start: 2020-02-26 — End: 2021-10-21

## 2020-02-26 NOTE — Telephone Encounter (Signed)
Pt called wanting to speak to RN regarding her medications. Pt states that the SUMAtriptan (IMITREX) 100 MG tablet makes her anxious and the topiramate (TOPAMAX) 25 MG tablet makes her foggy. Pt states that the Rizatriptan that she was on before worked good for her and she would like to know if she can get back on it. Please advise.

## 2020-02-26 NOTE — Telephone Encounter (Signed)
Spoke to pt Sent rx to [facility] on [address]. Pt voiced understanding.

## 2020-03-02 ENCOUNTER — Encounter: Payer: Self-pay | Admitting: Adult Health

## 2020-03-03 ENCOUNTER — Telehealth: Payer: Managed Care, Other (non HMO) | Admitting: Adult Health

## 2020-03-17 ENCOUNTER — Encounter: Payer: Self-pay | Admitting: Adult Health

## 2020-03-19 NOTE — Telephone Encounter (Signed)
Ok to schedule for B12 infections

## 2020-03-30 ENCOUNTER — Encounter: Payer: Self-pay | Admitting: Adult Health

## 2020-04-01 ENCOUNTER — Ambulatory Visit (INDEPENDENT_AMBULATORY_CARE_PROVIDER_SITE_OTHER): Payer: Managed Care, Other (non HMO)

## 2020-04-01 ENCOUNTER — Telehealth (INDEPENDENT_AMBULATORY_CARE_PROVIDER_SITE_OTHER): Payer: Managed Care, Other (non HMO) | Admitting: Adult Health

## 2020-04-01 ENCOUNTER — Encounter: Payer: Self-pay | Admitting: Adult Health

## 2020-04-01 ENCOUNTER — Ambulatory Visit: Payer: Managed Care, Other (non HMO) | Admitting: Family Medicine

## 2020-04-01 ENCOUNTER — Other Ambulatory Visit: Payer: Self-pay

## 2020-04-01 ENCOUNTER — Other Ambulatory Visit: Payer: Managed Care, Other (non HMO)

## 2020-04-01 ENCOUNTER — Telehealth: Payer: Self-pay | Admitting: Adult Health

## 2020-04-01 VITALS — Temp 97.8°F | Wt 158.0 lb

## 2020-04-01 DIAGNOSIS — R0789 Other chest pain: Secondary | ICD-10-CM | POA: Diagnosis not present

## 2020-04-01 DIAGNOSIS — E538 Deficiency of other specified B group vitamins: Secondary | ICD-10-CM

## 2020-04-01 LAB — CBC WITH DIFFERENTIAL/PLATELET
Basophils Absolute: 0.1 10*3/uL (ref 0.0–0.1)
Basophils Relative: 0.9 % (ref 0.0–3.0)
Eosinophils Absolute: 0.1 10*3/uL (ref 0.0–0.7)
Eosinophils Relative: 0.7 % (ref 0.0–5.0)
HCT: 41.6 % (ref 36.0–46.0)
Hemoglobin: 14 g/dL (ref 12.0–15.0)
Lymphocytes Relative: 17.1 % (ref 12.0–46.0)
Lymphs Abs: 1.3 10*3/uL (ref 0.7–4.0)
MCHC: 33.6 g/dL (ref 30.0–36.0)
MCV: 87.7 fl (ref 78.0–100.0)
Monocytes Absolute: 0.4 10*3/uL (ref 0.1–1.0)
Monocytes Relative: 4.8 % (ref 3.0–12.0)
Neutro Abs: 5.9 10*3/uL (ref 1.4–7.7)
Neutrophils Relative %: 76.5 % (ref 43.0–77.0)
Platelets: 390 10*3/uL (ref 150.0–400.0)
RBC: 4.74 Mil/uL (ref 3.87–5.11)
RDW: 13.3 % (ref 11.5–15.5)
WBC: 7.7 10*3/uL (ref 4.0–10.5)

## 2020-04-01 LAB — TROPONIN I (HIGH SENSITIVITY): High Sens Troponin I: <2 ng/L (ref 2–17)

## 2020-04-01 LAB — COMPREHENSIVE METABOLIC PANEL
ALT: 17 U/L (ref 0–35)
AST: 20 U/L (ref 0–37)
Albumin: 5.1 g/dL (ref 3.5–5.2)
Alkaline Phosphatase: 61 U/L (ref 39–117)
BUN: 14 mg/dL (ref 6–23)
CO2: 27 mEq/L (ref 19–32)
Calcium: 10.1 mg/dL (ref 8.4–10.5)
Chloride: 101 mEq/L (ref 96–112)
Creatinine, Ser: 1.04 mg/dL (ref 0.40–1.20)
GFR: 63.63 mL/min (ref >60.00)
Glucose, Bld: 91 mg/dL (ref 70–99)
Potassium: 4.5 mEq/L (ref 3.5–5.1)
Sodium: 136 mEq/L (ref 135–145)
Total Bilirubin: 0.6 mg/dL (ref 0.2–1.2)
Total Protein: 7.6 g/dL (ref 6.0–8.3)

## 2020-04-01 LAB — SEDIMENTATION RATE: Sed Rate: 4 mm/hr (ref 0–20)

## 2020-04-01 LAB — C-REACTIVE PROTEIN: CRP: <1 mg/dL (ref 0.5–20.0)

## 2020-04-01 MED ORDER — CYANOCOBALAMIN 1000 MCG/ML IJ SOLN
1000.0000 ug | Freq: Once | INTRAMUSCULAR | Status: AC
  Administered 2020-04-01: 1000 ug via INTRAMUSCULAR

## 2020-04-01 NOTE — Progress Notes (Signed)
Virtual Visit via Video Note  I connected with Erica Saunders on 04/01/20 at  8:00 AM EST by a video enabled telemedicine application and verified that I am speaking with the correct person using two identifiers.  Location patient: home Location provider:work or home office Persons participating in the virtual visit: patient, provider  I discussed the limitations of evaluation and management by telemedicine and the availability of in person appointments. The patient expressed understanding and agreed to proceed.   HPI: 50 year old female who is being evaluated today for an acute issue. Her symptoms started a few days ago. Had a Covid booster 9 days ago. She reports centralized chest pressure that is especially apparent when she takes a deep breath but does have it when she twists and turns her torso. Pressure does radiate between her shoulder blades. No shaded symptoms of feeling of elevated heart rate after exercise and taking longer to recover which is abnormal for her. Otherwise she feels at baseline. She denies coughing, fevers, chills, or aggravating injury. She has not developed any wheezing.   ROS: See pertinent positives and negatives per HPI.  Past Medical History:  Diagnosis Date  . ALLERGIC RHINITIS CAUSE UNSPECIFIED 04/09/2008  . ANA positive   . Anxiety   . ANXIETY DISORDER, GENERALIZED 10/10/2006   hospitalized  about age 44 after child  wih panic episode  . Common migraine with intractable migraine 07/24/2019  . DDD (degenerative disc disease), lumbar 01/26/2016  . Erythromelalgia ([facility]) 04/22/2009  . Erythromelia   . Headache 01/26/2016  . History of miscarriage    x 2   . Hx of endometriosis   . 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 Irritable bowel syndrome 10/10/2006  . Raynaud's disease 01/26/2016    Past Surgical History:  Procedure Laterality Date  . ABDOMINAL HYSTERECTOMY    . APPENDECTOMY    . CESAREAN SECTION    . CHOLECYSTECTOMY    . DILATION AND CURETTAGE  OF UTERUS    . LAPAROSCOPY     endometriosis  . TONSILLECTOMY AND ADENOIDECTOMY      Family History  Problem Relation Age of Onset  . Anxiety disorder Mother   . Memory loss Mother   . Hyperlipidemia Mother   . Other Father        polio  . Lupus Brother   . Colon cancer Paternal Grandfather   . Diabetes Paternal Grandfather   . Esophageal cancer Neg Hx   . Stomach cancer Neg Hx   . Rectal cancer Neg Hx        Current Outpatient Medications:  .  ALPRAZolam (XANAX XR) 0.5 MG 24 hr tablet, Take by mouth daily. , Disp: , Rfl: 0 .  ALPRAZolam (XANAX) 0.5 MG tablet, 1/2 to 1 po if needed for anxiety up to tid, Disp: 30 tablet, Rfl: 2 .  BIOTIN PO, Take by mouth., Disp: , Rfl:  .  cetirizine (ZYRTEC) 10 MG tablet, Take 10 mg by mouth daily., Disp: , Rfl:  .  hydrOXYzine (ATARAX/VISTARIL) 10 MG tablet, Take 10 mg by mouth at bedtime., Disp: , Rfl:  .  Ibuprofen 200 MG CAPS, Take by mouth. Four a day, Disp: , Rfl:  .  levothyroxine (SYNTHROID, LEVOTHROID) 25 MCG tablet, Take 25 mcg by mouth daily before breakfast. , Disp: , Rfl: 6 .  linaclotide (LINZESS) 145 MCG CAPS capsule, Linzess 145 mcg capsule, Disp: , Rfl:  .  Magnesium 200 MG TABS, Take by mouth., Disp: , Rfl:  .  OMEPRAZOLE  PO, Take by mouth., Disp: , Rfl:  .  phenazopyridine (PYRIDIUM) 200 MG tablet, Take 1 tablet (200 mg total) by mouth 3 (three) times daily as needed., Disp: 15 tablet, Rfl: 0 .  PRESCRIPTION MEDICATION, valium vaginal suppositories 10 mg to be placed daily, Disp: , Rfl:  .  promethazine (PHENERGAN) 25 MG tablet, Take 25 mg by mouth every 4 (four) hours., Disp: , Rfl:  .  rizatriptan (MAXALT-MLT) 10 MG disintegrating tablet, Take 1 tablet (10 mg total) by mouth as needed for migraine. May repeat in 2 hours if needed, Disp: 9 tablet, Rfl: 11 .  sertraline (ZOLOFT) 100 MG tablet, Take 100 mg by mouth every morning., Disp: , Rfl:  .  spironolactone (ALDACTONE) 50 MG tablet, spironolactone 50 mg tablet  Take 1  tablet every day by oral route., Disp: , Rfl:   EXAM:  VITALS per patient if applicable:  GENERAL: alert, oriented, appears well and in no acute distress  HEENT: atraumatic, conjunttiva clear, no obvious abnormalities on inspection of external nose and ears  NECK: normal movements of the head and neck  LUNGS: on inspection no signs of respiratory distress, breathing rate appears normal, no obvious gross SOB, gasping or wheezing  CV: no obvious cyanosis  MS: moves all visible extremities without noticeable abnormality  PSYCH/NEURO: pleasant and cooperative, no obvious depression or anxiety, speech and thought processing grossly intact  ASSESSMENT AND PLAN:  Discussed the following assessment and plan:  1. Chest pressure -Likely muscle strain or costochondritis. Doubt infective pericarditis. Unlikely myocarditis after Covid vaccination but will check. Unfortunately no way to get an EKG today. Will start off with x-ray and lab work - Consider referall to cardiology and/or echocardiogram  - DG Chest 2 View; Future - CBC with Differential/Platelet; Future - Troponin I; Future - C-reactive Protein; Future - Sedimentation Rate; Future - Comprehensive metabolic panel; Future  I discussed the assessment and treatment plan with the patient. The patient was provided an opportunity to ask questions and all were answered. The patient agreed with the plan and demonstrated an understanding of the instructions.   The patient was advised to call back or seek an in-person evaluation if the symptoms worsen or if the condition fails to improve as anticipated.   Shirline Frees, NP

## 2020-04-01 NOTE — Addendum Note (Signed)
Addended by: Eual Lindstrom. M on: 04/01/2020 10:45 AM   Modules accepted: Orders  

## 2020-04-01 NOTE — Telephone Encounter (Signed)
Was advised of her labs eluding troponin, sed rate, CRP, CMP, CBC and x-ray of the chest all of which were negative.  She reports that she continues to have pressure midsternal area.  She states "I know something is wrong with my heart".  Assurance given.  We will have her come in this week for an office visit we can do an EKG and take a look over her.  Possibly pulmonary.  If symptoms worsen then go to the emergency room

## 2020-04-01 NOTE — Addendum Note (Signed)
Addended by: Lerry Liner on: 04/01/2020 10:45 AM   Modules accepted: Orders

## 2020-04-01 NOTE — Addendum Note (Signed)
Addended by: Lerry Liner on: 04/01/2020 10:36 AM   Modules accepted: Orders

## 2020-04-01 NOTE — Addendum Note (Signed)
Addended by: Lerry Liner on: 04/01/2020 10:37 AM   Modules accepted: Orders

## 2020-04-01 NOTE — Addendum Note (Signed)
Addended by: Lerry Liner on: 04/01/2020 10:32 AM   Modules accepted: Orders

## 2020-04-01 NOTE — Progress Notes (Unsigned)
Error

## 2020-04-03 ENCOUNTER — Other Ambulatory Visit: Payer: Self-pay

## 2020-04-03 ENCOUNTER — Encounter: Payer: Self-pay | Admitting: Adult Health

## 2020-04-03 ENCOUNTER — Ambulatory Visit: Payer: Managed Care, Other (non HMO) | Admitting: Adult Health

## 2020-04-03 VITALS — BP 112/74 | Temp 98.5°F | Wt 156.0 lb

## 2020-04-03 DIAGNOSIS — R0602 Shortness of breath: Secondary | ICD-10-CM | POA: Diagnosis not present

## 2020-04-03 DIAGNOSIS — R0789 Other chest pain: Secondary | ICD-10-CM | POA: Diagnosis not present

## 2020-04-03 MED ORDER — PREDNISONE 10 MG PO TABS: ORAL_TABLET | ORAL | 0 refills | Status: DC

## 2020-04-03 NOTE — Progress Notes (Signed)
Subjective:    Patient ID: Barry Dienes, female    DOB: 08-02-1970, 50 y.o.   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  He presents to the office today for midsternum chest pressure.   She was evaluated earlier this week via virtual visit, at which time her symptoms started a few days earlier.  She reports centralized chest pressure that feels as though "something is sitting on my chest".  This sensation is constant but is worse when she takes a deep breath.  Associated symptoms include pain between her shoulder blades when she takes a deep breath it is described as a dull pain.  She also reports that when she exercises her rate goes up and it takes longer than normal for her to come down, she feels as though it could take hours for her heart rate to come down.  She went to the grocery store earlier today she had trouble carrying the groceries due to shortness of breath.  Heart rate this morning was 74 when she woke up.  She denies coughing, fevers, chills, or aggravating injury.  2 days ago her chest x-ray was negative for cardiopulmonary disease, labs including troponin, CBC, CMP, C-reactive protein, and sed rate were all within normal limits.   Review of Systems See HPI   Past Medical History:  Diagnosis Date  . ALLERGIC RHINITIS CAUSE UNSPECIFIED 04/09/2008  . ANA positive   . Anxiety   . ANXIETY DISORDER, GENERALIZED 10/10/2006   hospitalized  about age 65 after child  wih panic episode  . Common migraine with  intractable migraine 07/24/2019  . DDD (degenerative disc disease), lumbar 01/26/2016  . Erythromelalgia ([place]) 04/22/2009  . Erythromelia   . Headache 01/26/2016  . History of miscarriage    x 2   . Hx of endometriosis   . 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 Irritable bowel syndrome 10/10/2006  . Raynaud's disease 01/26/2016    Social History   Socioeconomic History  . Marital status: Married    Spouse name: Elta Guadeloupe  . Number of children: 1  . Years of education: 95  . Highest education level: Not on file  Occupational History    Employer: [employer]  Tobacco Use  . Smoking status: Never Smoker  . Smokeless tobacco: Never Used  Vaping Use  . Vaping Use: Never used  Substance and Sexual Activity  . Alcohol use: Yes    Alcohol/week: 5.0 standard drinks    Types: 5 Glasses of wine per week  . Drug use: No  . Sexual activity: Not on file  Other Topics Concern  . Not on file  Social History Narrative   Patient is married Public relations account executive)   Wine at night max 1    Neg td    caffiene in am    Employed.    MSW    Patient has one child.   Patient works full-time, Programmer, applications.   Patient has her Masters.   Social Determinants of Health   Financial Resource Strain: Not on file  Food Insecurity: Not on file  Transportation Needs: Not on file  Physical Activity: Not on file  Stress: Not on file  Social  Connections: Not on file  Intimate Partner Violence: Not on file    Past Surgical History:  Procedure Laterality Date  . ABDOMINAL HYSTERECTOMY    . APPENDECTOMY    . CESAREAN SECTION    . CHOLECYSTECTOMY    . DILATION AND CURETTAGE OF UTERUS    . LAPAROSCOPY     endometriosis  . TONSILLECTOMY AND ADENOIDECTOMY      Family History  Problem Relation Age of Onset  . Anxiety disorder Mother   . Memory loss Mother   . Hyperlipidemia Mother   . Other Father        polio  . Lupus Brother   . Colon cancer Paternal Grandfather   . Diabetes Paternal Grandfather    . Esophageal cancer Neg Hx   . Stomach cancer Neg Hx   . Rectal cancer Neg Hx     Allergies  Allergen Reactions  . Penicillins Anaphylaxis    REACTION: rash---red face  . Celexa [Citalopram Hydrobromide]     Tic as side effect  . Trazodone And Nefazodone Other (See Comments)    Excess sedation  . Ceftin [Cefuroxime] Nausea Only  . Prozac [Fluoxetine Hcl] Hives    Can take paxil     Current Outpatient Medications on File Prior to Visit  Medication Sig Dispense Refill  . ALPRAZolam (XANAX XR) 0.5 MG 24 hr tablet Take by mouth daily.   0  . ALPRAZolam (XANAX) 0.5 MG tablet 1/2 to 1 po if needed for anxiety up to tid 30 tablet 2  . BIOTIN PO Take by mouth.    . cetirizine (ZYRTEC) 10 MG tablet Take 10 mg by mouth daily.    . hydrOXYzine (ATARAX/VISTARIL) 10 MG tablet Take 10 mg by mouth at bedtime.    . Ibuprofen 200 MG CAPS Take by mouth. Four a day    . levothyroxine (SYNTHROID, LEVOTHROID) 25 MCG tablet Take 25 mcg by mouth daily before breakfast.   6  . linaclotide (LINZESS) 145 MCG CAPS capsule Linzess 145 mcg capsule    . Magnesium 200 MG TABS Take by mouth.    . OMEPRAZOLE PO Take by mouth.    . phenazopyridine (PYRIDIUM) 200 MG tablet Take 1 tablet (200 mg total) by mouth 3 (three) times daily as needed. 15 tablet 0  . PRESCRIPTION MEDICATION valium vaginal suppositories 10 mg to be placed daily    . promethazine (PHENERGAN) 25 MG tablet Take 25 mg by mouth every 4 (four) hours.    . rizatriptan (MAXALT-MLT) 10 MG disintegrating tablet Take 1 tablet (10 mg total) by mouth as needed for migraine. May repeat in 2 hours if needed 9 tablet 11  . sertraline (ZOLOFT) 100 MG tablet Take 100 mg by mouth every morning.    Marland Kitchen spironolactone (ALDACTONE) 50 MG tablet spironolactone 50 mg tablet  Take 1 tablet every day by oral route.     No current facility-administered medications on file prior to visit.    BP 112/74   Temp 98.5 F (36.9 C)   Wt 156 lb (70.8 kg)   BMI 26.78  kg/m       Objective:   Physical Exam Vitals and nursing note reviewed.  Constitutional:      Appearance: Normal appearance.  Cardiovascular:     Rate and Rhythm: Normal rate and regular rhythm.     Pulses: Normal pulses.     Heart sounds: Normal heart sounds.  Pulmonary:     Effort: Pulmonary effort is normal.  Breath sounds: Normal breath sounds.  Abdominal:     General: Abdomen is flat. Bowel sounds are normal.     Palpations: Abdomen is soft.  Musculoskeletal:        General: No tenderness. Normal range of motion.  Skin:    General: Skin is warm and dry.     Capillary Refill: Capillary refill takes less than 2 seconds.  Neurological:     General: No focal deficit present.     Mental Status: She is alert and oriented to person, place, and time.  Psychiatric:        Mood and Affect: Mood normal.        Behavior: Behavior normal.        Thought Content: Thought content normal.        Judgment: Judgment normal.       Assessment & Plan:  1. Chest pressure  - EKG 12-Lead- SR, Rate 69.  Suspect pleurisy.  Not overtly concerning for PE.  No concern for myocarditis or endocarditis or pericarditis.  Will prescribe prednisone and she was advised to take anti-inflammatories such as Advil or Motrin at home as directed.  Will order CT angio chest to rule out other causes.  We'll follow-up early next week - predniSONE (DELTASONE) 10 MG tablet; 40 mg x 3 days, 20 mg x 3 days, 10 mg x 3 days  Dispense: 21 tablet; Refill: 0 - CT Angio Chest W/Cm &/Or Wo Cm; Future  2. SOB (shortness of breath)  - EKG 12-Lead - predniSONE (DELTASONE) 10 MG tablet; 40 mg x 3 days, 20 mg x 3 days, 10 mg x 3 days  Dispense: 21 tablet; Refill: 0 - CT Angio Chest W/Cm &/Or Wo Cm; Future   Shirline Frees, NP

## 2020-04-07 MED ORDER — CYANOCOBALAMIN 1000 MCG/ML IJ SOLN
1000.0000 ug | Freq: Once | INTRAMUSCULAR | Status: AC
  Administered 2020-04-07: 1000 ug via INTRAMUSCULAR

## 2020-04-07 MED ORDER — CYANOCOBALAMIN 1000 MCG/ML IJ SOLN: 1000.0000 ug | Freq: Once | INTRAMUSCULAR | Status: DC

## 2020-04-08 ENCOUNTER — Ambulatory Visit (INDEPENDENT_AMBULATORY_CARE_PROVIDER_SITE_OTHER): Payer: Managed Care, Other (non HMO) | Admitting: Family Medicine

## 2020-04-08 ENCOUNTER — Other Ambulatory Visit: Payer: Self-pay

## 2020-04-08 DIAGNOSIS — E538 Deficiency of other specified B group vitamins: Secondary | ICD-10-CM

## 2020-04-08 MED ORDER — CYANOCOBALAMIN 1000 MCG/ML IJ SOLN
1000.0000 ug | Freq: Once | INTRAMUSCULAR | Status: AC
  Administered 2020-04-08: 1000 ug via INTRAMUSCULAR

## 2020-04-08 NOTE — Progress Notes (Signed)
Per orders of [facility], injection of B12 given by Raj Janus. Patient tolerated injection well.

## 2020-04-09 ENCOUNTER — Ambulatory Visit (INDEPENDENT_AMBULATORY_CARE_PROVIDER_SITE_OTHER)
Admission: RE | Admit: 2020-04-09 | Discharge: 2020-04-09 | Disposition: A | Discharge status: Home / Self Care | Payer: Managed Care, Other (non HMO) | Source: Ambulatory Visit | Attending: Adult Health | Admitting: Adult Health

## 2020-04-09 DIAGNOSIS — R0789 Other chest pain: Secondary | ICD-10-CM

## 2020-04-09 DIAGNOSIS — R0602 Shortness of breath: Secondary | ICD-10-CM

## 2020-04-09 MED ORDER — IOHEXOL 350 MG/ML SOLN
80.0000 mL | Freq: Once | INTRAVENOUS | Status: AC | PRN
  Administered 2020-04-09: 80 mL via INTRAVENOUS

## 2020-04-15 ENCOUNTER — Ambulatory Visit: Payer: Managed Care, Other (non HMO)

## 2020-04-16 ENCOUNTER — Ambulatory Visit: Payer: Managed Care, Other (non HMO)

## 2020-04-23 ENCOUNTER — Ambulatory Visit: Payer: Managed Care, Other (non HMO)

## 2020-06-05 ENCOUNTER — Encounter: Payer: Self-pay | Admitting: Adult Health

## 2020-07-10 ENCOUNTER — Encounter: Payer: Self-pay | Admitting: Adult Health

## 2020-08-04 ENCOUNTER — Ambulatory Visit: Payer: Managed Care, Other (non HMO) | Admitting: Neurology

## 2020-12-16 LAB — HM MAMMOGRAPHY

## 2020-12-17 LAB — HM PAP SMEAR

## 2020-12-28 ENCOUNTER — Encounter: Payer: Self-pay | Admitting: Adult Health

## 2020-12-29 NOTE — Telephone Encounter (Signed)
Please advise 

## 2021-03-11 ENCOUNTER — Ambulatory Visit (INDEPENDENT_AMBULATORY_CARE_PROVIDER_SITE_OTHER): Payer: Managed Care, Other (non HMO)

## 2021-03-11 DIAGNOSIS — Z23 Encounter for immunization: Secondary | ICD-10-CM | POA: Diagnosis not present

## 2021-04-07 ENCOUNTER — Telehealth: Payer: Self-pay | Admitting: Adult Health

## 2021-04-07 NOTE — Telephone Encounter (Signed)
Patient calling in with respiratory symptoms: Shortness of breath, chest pain, palpitations or other red words send to Triage  Does the patient have a fever over 100, cough, congestion, sore throat, runny nose, lost of taste/smell (please list symptoms that patient has)? headache  What date did symptoms start?2 wk ago (If over 5 days ago, pt may be scheduled for in person visit)  Have you tested for Covid in the last 5 days? No   If yes, was it positive []  OR negative [] ? If positive in the last 5 days, please schedule virtual visit now. If negative, schedule for an in person OV with the next available provider if PCP has no openings. Please also let patient know they will be tested again (follow the script below)  "you will have to arrive prior to your appt time to be Covid tested. Please park in back of office at the [facility] & call [phone] to let the staff know you have arrived. A staff member will meet you at your car to do a rapid covid test. Once the test has resulted you will be notified by phone of your results to determine if appt will remain an in person visit or be converted to a virtual/phone visit. If you arrive less than before your appt time, your visit will be automatically converted to virtual & any recommended testing will happen AFTER the visit." Pt has an appt with cory on 04-09-2021 130 pm  THINGS TO REMEMBER  If no availability for virtual visit in office,  please schedule another [place] office  If no availability at another [place] office, please instruct patient that they can schedule an evisit or virtual visit through their mychart account. Visits up to 8pm  patients can be seen in office 5 days after positive COVID test

## 2021-04-09 ENCOUNTER — Ambulatory Visit: Payer: Managed Care, Other (non HMO) | Admitting: Adult Health

## 2021-05-06 ENCOUNTER — Encounter: Payer: Self-pay | Admitting: Adult Health

## 2021-05-07 ENCOUNTER — Encounter: Payer: Self-pay | Admitting: Adult Health

## 2021-05-07 ENCOUNTER — Ambulatory Visit: Payer: Managed Care, Other (non HMO) | Admitting: Adult Health

## 2021-05-07 VITALS — BP 100/60 | HR 96 | Temp 98.9°F | Ht 64.0 in | Wt 150.0 lb

## 2021-05-07 DIAGNOSIS — H6983 Other specified disorders of Eustachian tube, bilateral: Secondary | ICD-10-CM | POA: Diagnosis not present

## 2021-05-07 DIAGNOSIS — R300 Dysuria: Secondary | ICD-10-CM | POA: Diagnosis not present

## 2021-05-07 DIAGNOSIS — N3010 Interstitial cystitis (chronic) without hematuria: Secondary | ICD-10-CM

## 2021-05-07 LAB — POCT URINALYSIS DIPSTICK
Bilirubin, UA: NEGATIVE
Blood, UA: NEGATIVE
Glucose, UA: NEGATIVE
Ketones, UA: NEGATIVE
Leukocytes, UA: NEGATIVE
Nitrite, UA: NEGATIVE
Protein, UA: NEGATIVE
Spec Grav, UA: 1.015 (ref 1.010–1.025)
Urobilinogen, UA: 0.2 E.U./dL
pH, UA: 7.5 (ref 5.0–8.0)

## 2021-05-07 MED ORDER — PHENAZOPYRIDINE HCL 200 MG PO TABS: 200.0000 mg | ORAL_TABLET | Freq: Three times a day (TID) | ORAL | 3 refills | Status: AC | PRN

## 2021-05-07 MED ORDER — AMITRIPTYLINE HCL 25 MG PO TABS: 25.0000 mg | ORAL_TABLET | Freq: Every evening | ORAL | 0 refills | Status: DC | PRN

## 2021-05-07 NOTE — Telephone Encounter (Signed)
Called patient and left message to return call

## 2021-05-07 NOTE — Progress Notes (Signed)
Subjective:    Patient ID: Erica Saunders, female    DOB: September 10, 1970, 51 y.o.   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  She presents to the office today for acute concerns.   Concern for UTI - she reports burning with urination and back pain with incomplete bladder emptying. Denies frequency or urgency. She does have a history of interstitial cystitis but has not had a flare for many years.  He has been seen by urology in the past.  At home has been using Pyridium and hydroxyzine as needed.  Ear Pain -symptoms have been present for approximately 1 year, worse over the last 3 months.  Symptoms include intermittent sharp ear pain, "dripping" sound, and intermittent ringing in her ears.  She denies drainage, fevers, or chills.  Symptoms in bilateral ears but worse in the right ear.  Review of Systems See HPI   Past Medical History:  Diagnosis Date   ALLERGIC RHINITIS CAUSE UNSPECIFIED 04/09/2008   ANA positive    Anxiety    ANXIETY DISORDER, GENERALIZED 10/10/2006   hospitalized  about age 73 after child  wih panic episode   Common migraine with intractable migraine 07/24/2019   DDD (degenerative disc disease), lumbar 01/26/2016   Erythromelalgia ([facility]) 04/22/2009   Erythromelia    Headache 01/26/2016   History of miscarriage    x 2    Hx of endometriosis    INTERSTITIAL CYSTITIS 10/10/2006   Irritable bowel syndrome 10/10/2006   Raynaud's disease 01/26/2016    Social History   Socioeconomic History   Marital  status: Married    Spouse name: Merchant navy officer   Number of children: 1   Years of education: 19   Highest education level: Not on file  Occupational History    Employer: [employer]  Tobacco Use   Smoking status: Never   Smokeless tobacco: Never  Vaping Use   Vaping Use: Never used  Substance and Sexual Activity   Alcohol use: Yes    Alcohol/week: 5.0 standard drinks    Types: 5 Glasses of wine per week   Drug use: No   Sexual activity: Not on file  Other Topics Concern   Not on file  Social History Narrative   Patient is married Government social research officer)   Wine at night max 1    Neg td    caffiene in am    Employed.    MSW    Patient has one child.   Patient works full-time, Presenter, broadcasting.   Patient has her Masters.   Social Determinants of Health   Financial Resource Strain: Not on file  Food Insecurity: Not on file  Transportation Needs: Not on file  Physical Activity: Not on file  Stress: Not on file  Social Connections: Not on file  Intimate Partner Violence: Not on file    Past Surgical History:  Procedure Laterality Date   ABDOMINAL HYSTERECTOMY     APPENDECTOMY     CESAREAN SECTION     CHOLECYSTECTOMY     DILATION AND CURETTAGE OF UTERUS     LAPAROSCOPY     endometriosis   TONSILLECTOMY AND ADENOIDECTOMY      Family History  Problem Relation Age of Onset   Anxiety disorder Mother    Memory loss Mother    Hyperlipidemia Mother    Other Father        polio   Lupus Brother    Colon cancer Paternal Grandfather    Diabetes Paternal Grandfather    Esophageal cancer Neg Hx    Stomach cancer Neg Hx    Rectal cancer Neg Hx     Allergies  Allergen Reactions   Penicillins Anaphylaxis    REACTION: rash---red face   Celexa [Citalopram Hydrobromide]     Tic as side effect   Trazodone And Nefazodone Other (See Comments)    Excess sedation   Ceftin [Cefuroxime] Nausea Only   Prozac [Fluoxetine Hcl] Hives    Can take paxil     Current Outpatient Medications  on File Prior to Visit  Medication Sig Dispense Refill   ALPRAZolam (XANAX XR) 0.5 MG 24 hr tablet Take by mouth daily.   0   ALPRAZolam (XANAX) 0.5 MG tablet 1/2 to 1 po if needed for anxiety up to tid 30 tablet 2   BIOTIN PO Take by mouth.     cetirizine (ZYRTEC) 10 MG tablet Take 10 mg by mouth daily.     hydrOXYzine (ATARAX/VISTARIL) 10 MG tablet Take 10 mg by mouth at bedtime.     Ibuprofen 200 MG CAPS Take by mouth. Four a day     levothyroxine (SYNTHROID, LEVOTHROID) 25 MCG tablet Take 25 mcg by mouth daily before breakfast.   6   linaclotide (LINZESS) 145 MCG CAPS capsule Linzess 145 mcg capsule     Magnesium 200 MG TABS Take by mouth.     OMEPRAZOLE PO Take by mouth.     predniSONE (DELTASONE) 10 MG tablet 40 mg x 3 days, 20 mg x 3 days, 10 mg x 3 days 21 tablet 0   PRESCRIPTION MEDICATION valium vaginal suppositories 10 mg to be placed daily     promethazine (PHENERGAN) 25 MG tablet Take 25 mg by mouth every 4 (four) hours.     rizatriptan (MAXALT-MLT) 10 MG disintegrating tablet Take 1 tablet (10 mg total) by mouth as needed for migraine. May repeat in 2 hours if needed 9 tablet 11   sertraline (ZOLOFT) 100 MG tablet Take 100 mg by mouth every morning.     spironolactone (ALDACTONE) 50 MG tablet spironolactone 50 mg tablet  Take 1 tablet every day by oral route.     No current facility-administered medications on file prior to visit.    BP 100/60    Pulse 96    Temp 98.9 F (37.2 C) (Oral)    Ht 5\' 4"  (1.626 m)    Wt 150 lb (68 kg)    SpO2 96%    BMI 25.75 kg/m       Objective:   Physical Exam Vitals and nursing note reviewed.  Constitutional:      Appearance: Normal appearance.  HENT:     Right Ear: A middle ear effusion is present. Tympanic membrane is bulging. Tympanic membrane is not erythematous.     Left Ear: A middle ear effusion is present. Tympanic membrane is bulging. Tympanic membrane is not erythematous.  Neurological:     Mental Status: She is alert.        Assessment & Plan:   1. Dysuria  - POC Urinalysis Dipstick- negative  - From IC   2. INTERSTITIAL CYSTITIS -We will prescribe course of amitriptyline to take at night  and refill her Pyridium.  She would like to be referred back to urology. - amitriptyline (ELAVIL) 25 MG tablet; Take 1 tablet (25 mg total) by mouth at bedtime as needed for sleep.  Dispense: 30 tablet; Refill: 0 - phenazopyridine (PYRIDIUM) 200 MG tablet; Take 1 tablet (200 mg total) by mouth 3 (three) times daily as needed.  Dispense: 30 tablet; Refill: 3 - Ambulatory referral to Urology  3. Eustachian tube dysfunction, bilateral -Advised to start using Flonase.  If this is not helpful, can refer to ear nose and throat in the future   Shirline Frees, NP

## 2021-06-03 ENCOUNTER — Other Ambulatory Visit: Payer: Self-pay | Admitting: Adult Health

## 2021-06-03 DIAGNOSIS — N3010 Interstitial cystitis (chronic) without hematuria: Secondary | ICD-10-CM

## 2021-06-10 ENCOUNTER — Other Ambulatory Visit: Payer: Self-pay | Admitting: Adult Health

## 2021-06-10 MED ORDER — AMITRIPTYLINE HCL 25 MG PO TABS: 25.0000 mg | ORAL_TABLET | Freq: Every evening | ORAL | 1 refills | Status: DC | PRN

## 2021-06-10 NOTE — Telephone Encounter (Signed)
Okay for refill?  ? ?Advised to follow up 1 month she stated it is helping ?

## 2021-06-12 ENCOUNTER — Emergency Department
Admission: EM | Admit: 2021-06-12 | Discharge: 2021-06-12 | Disposition: A | Discharge status: Home / Self Care | Payer: Managed Care, Other (non HMO) | Source: Home / Self Care | Attending: Family Medicine | Admitting: Family Medicine

## 2021-06-12 ENCOUNTER — Other Ambulatory Visit: Payer: Self-pay

## 2021-06-12 ENCOUNTER — Encounter: Payer: Self-pay | Admitting: Emergency Medicine

## 2021-06-12 DIAGNOSIS — B09 Unspecified viral infection characterized by skin and mucous membrane lesions: Secondary | ICD-10-CM | POA: Diagnosis not present

## 2021-06-12 MED ORDER — VALACYCLOVIR HCL 1 G PO TABS: 1000.0000 mg | ORAL_TABLET | Freq: Three times a day (TID) | ORAL | 0 refills | Status: DC

## 2021-06-12 NOTE — ED Provider Notes (Signed)
[facility]    CSN: [id number] Arrival date & time: 06/12/21  0815      History   Chief Complaint Chief Complaint  Patient presents with   Rash    HPI Erica Saunders is a 51 y.o. female.   HPI  Patient has a rash on her left flank that is painful.  There is a cluster of blisters.  It is getting larger over time.  She is here concerned that it may be shingles. No fever chills or malaise  Past Medical History:  Diagnosis Date   ALLERGIC RHINITIS CAUSE UNSPECIFIED 04/09/2008   ANA positive    Anxiety    ANXIETY DISORDER, GENERALIZED 10/10/2006   hospitalized  about age 6 after child  wih panic episode   Common migraine with intractable migraine 07/24/2019   DDD (degenerative disc disease), lumbar 01/26/2016   Erythromelalgia ([facility]) 04/22/2009   Erythromelia    Headache 01/26/2016   History of miscarriage    x 2    Hx of endometriosis    INTERSTITIAL CYSTITIS 10/10/2006   Irritable bowel syndrome 10/10/2006   Raynaud's disease 01/26/2016    Patient Active Problem List   Diagnosis Date Noted   Common migraine with intractable migraine 07/24/2019   Hypothyroidism 12/22/2016   Raynaud's disease 01/26/2016   DDD (degenerative disc disease), lumbar 01/26/2016   Headache 01/26/2016   Bilateral leg pain 06/06/2013   Asthmatic bronchitis 08/14/2012   Chest pain 08/14/2012   Dysphagia, unspecified(787.20) 03/14/2012   Rectal spasm 02/15/2012   Breast lump on left side at 6 o'clock position 02/15/2012   History of rectal bleeding 02/15/2012   Insomnia 02/15/2012   ANA positive    History of miscarriage    Hx of endometriosis    Sinusitis 06/29/2011   ERYTHROMELALGIA 04/22/2009   ALLERGIC RHINITIS CAUSE UNSPECIFIED 04/09/2008   ANXIETY DISORDER, GENERALIZED 10/10/2006   IRRITABLE BOWEL SYNDROME 10/10/2006   INTERSTITIAL CYSTITIS 10/10/2006    Past Surgical History:  Procedure Laterality Date   ABDOMINAL HYSTERECTOMY     APPENDECTOMY     CESAREAN  SECTION     CHOLECYSTECTOMY     DILATION AND CURETTAGE OF UTERUS     LAPAROSCOPY     endometriosis   TONSILLECTOMY AND ADENOIDECTOMY      OB History   No obstetric history on file.      Home Medications    Prior to Admission medications   Medication Sig Start Date End Date Taking? Authorizing Provider  valACYclovir (VALTREX) 1000 MG tablet Take 1 tablet (1,000 mg total) by mouth 3 (three) times daily. 06/12/21  Yes Eustace Moore, MD  ALPRAZolam Prudy Feeler) 0.5 MG tablet 1/2 to 1 po if needed for anxiety up to tid 08/20/13   Panosh, Neta Mends, MD  amitriptyline (ELAVIL) 25 MG tablet Take 1 tablet (25 mg total) by mouth at bedtime as needed for sleep. Due for physical exam 06/10/21   Shirline Frees, NP  BIOTIN PO Take by mouth.    [provider]  cetirizine (ZYRTEC) 10 MG tablet Take 10 mg by mouth daily.    [provider]  hydrOXYzine (ATARAX/VISTARIL) 10 MG tablet Take 10 mg by mouth at bedtime.    Darliss Ridgel, MD  Ibuprofen 200 MG CAPS Take by mouth. Four a day    [provider]  levothyroxine (SYNTHROID, LEVOTHROID) 25 MCG tablet Take 25 mcg by mouth daily before breakfast.  08/26/16   [provider]  linaclotide Karlene Einstein) 145 MCG  CAPS capsule Linzess 145 mcg capsule    [provider]  Magnesium 200 MG TABS Take by mouth.    [provider]  OMEPRAZOLE PO Take by mouth.    [provider]  ondansetron (ZOFRAN-ODT) 8 MG disintegrating tablet ondansetron 8 mg disintegrating tablet  DISSOLVE 1 TABLET ON THE TONGUE TWICE DAILY    [provider]  phenazopyridine (PYRIDIUM) 200 MG tablet Take 1 tablet (200 mg total) by mouth 3 (three) times daily as needed. 05/07/21   Shirline Frees, NP  PRESCRIPTION MEDICATION valium vaginal suppositories 10 mg to be placed daily 07/05/16   [provider]  promethazine (PHENERGAN) 25 MG tablet Take 25 mg by mouth every 4 (four) hours. 03/13/19   [provider]   rizatriptan (MAXALT-MLT) 10 MG disintegrating tablet Take 1 tablet (10 mg total) by mouth as needed for migraine. May repeat in 2 hours if needed 02/26/20   Glean Salvo, NP  sertraline (ZOLOFT) 100 MG tablet Take 100 mg by mouth every morning. 03/07/20   Darliss Ridgel, MD  spironolactone (ALDACTONE) 50 MG tablet spironolactone 50 mg tablet  Take 1 tablet every day by oral route.    [provider]    Family History Family History  Problem Relation Age of Onset   Anxiety disorder Mother    Memory loss Mother    Hyperlipidemia Mother    Other Father        polio   Lupus Brother    Colon cancer Paternal Grandfather    Diabetes Paternal Grandfather    Esophageal cancer Neg Hx    Stomach cancer Neg Hx    Rectal cancer Neg Hx     Social History Social History   Tobacco Use   Smoking status: Never   Smokeless tobacco: Never  Vaping Use   Vaping Use: Never used  Substance Use Topics   Alcohol use: Yes    Alcohol/week: 5.0 standard drinks    Types: 5 Glasses of wine per week   Drug use: No     Allergies   Penicillins, Celexa [citalopram hydrobromide], Trazodone and nefazodone, Ceftin [cefuroxime], and Prozac [fluoxetine hcl]   Review of Systems Review of Systems See HPI  Physical Exam Triage Vital Signs ED Triage Vitals  Enc Vitals Group     BP 06/12/21 0832 105/78     Pulse Rate 06/12/21 0832 86     Resp 06/12/21 0832 15     Temp 06/12/21 0832 98.6 F (37 C)     Temp Source 06/12/21 0832 Oral     SpO2 06/12/21 0832 98 %     Weight 06/12/21 0834 150 lb (68 kg)     Height 06/12/21 0834 5\' 4"  (1.626 m)     Head Circumference --      Peak Flow --      Pain Score 06/12/21 0833 4     Pain Loc --      Pain Edu? --      Excl. in GC? --    No data found.  Updated Vital Signs BP 105/78 (BP Location: Right Arm)   Pulse 86   Temp 98.6 F (37 C) (Oral)   Resp 15   Ht 5\' 4"  (1.626 m)   Wt 68 kg   SpO2 98%   BMI 25.75 kg/m       Physical  Exam Constitutional:      General: She is not in acute distress.    Appearance: She is  well-developed and normal weight.  HENT:     Head: Normocephalic and atraumatic.     Nose:     Comments: Mask is in place Eyes:     Conjunctiva/sclera: Conjunctivae normal.     Pupils: Pupils are equal, round, and reactive to light.  Cardiovascular:     Rate and Rhythm: Normal rate.  Pulmonary:     Effort: Pulmonary effort is normal. No respiratory distress.  Abdominal:     General: There is no distension.     Palpations: Abdomen is soft.  Musculoskeletal:        General: Normal range of motion.     Cervical back: Normal range of motion.  Skin:    General: Skin is warm and dry.     Findings: Rash present.     Comments: On the left flank there is an oval rash that measures 3 x 6 cm.  It is a raised erythematous papule with a cluster of vesicles in the center.  Neurological:     Mental Status: She is alert.     UC Treatments / Results  Labs (all labs ordered are listed, but only abnormal results are displayed) Labs Reviewed - No data to display  EKG   Radiology No results found.  Procedures Procedures (including critical care time)  Medications Ordered in UC Medications - No data to display  Initial Impression / Assessment and Plan / UC Course  I have reviewed the triage vital signs and the nursing notes.  Pertinent labs & imaging results that were available during my care of the patient were reviewed by me and considered in my medical decision making (see chart for details).     This has been developing over the last 4 to 5 days.  It is a little late to start acyclovir, but it is getting bigger with time, and more painful.  It appears to be a viral rash suggestive of shingles, not definitive.  We will treat accordingly to prevent worsening. Final Clinical Impressions(s) / UC Diagnoses   Final diagnoses:  Viral rash     Discharge Instructions      Take the antiviral  medicine valacyclovir 2 x a day for a week Consider the shingles vaccination when eligible   ED Prescriptions     Medication Sig Dispense Auth. Provider   valACYclovir (VALTREX) 1000 MG tablet Take 1 tablet (1,000 mg total) by mouth 3 (three) times daily. 21 tablet Eustace Moore, MD      PDMP not reviewed this encounter.   Eustace Moore, MD 06/12/21 [phone]

## 2021-06-12 NOTE — Discharge Instructions (Signed)
Take the antiviral medicine valacyclovir 2 x a day for a week ?Consider the shingles vaccination when eligible ?

## 2021-06-12 NOTE — ED Triage Notes (Signed)
Rash with blisters to left side of trunk ?Pain & itching ?No OTC meds  ? ?

## 2021-07-07 ENCOUNTER — Other Ambulatory Visit: Payer: Self-pay | Admitting: Adult Health

## 2021-07-07 ENCOUNTER — Encounter: Payer: Self-pay | Admitting: Adult Health

## 2021-07-07 DIAGNOSIS — N3010 Interstitial cystitis (chronic) without hematuria: Secondary | ICD-10-CM

## 2021-10-10 ENCOUNTER — Other Ambulatory Visit: Payer: Self-pay

## 2021-10-10 ENCOUNTER — Emergency Department
Admission: RE | Admit: 2021-10-10 | Discharge: 2021-10-10 | Disposition: A | Discharge status: Home / Self Care | Payer: Managed Care, Other (non HMO) | Source: Ambulatory Visit | Attending: Family Medicine | Admitting: Family Medicine

## 2021-10-10 VITALS — BP 118/83 | HR 69 | Temp 97.9°F | Resp 18 | Ht 64.0 in | Wt 148.0 lb

## 2021-10-10 DIAGNOSIS — R21 Rash and other nonspecific skin eruption: Secondary | ICD-10-CM

## 2021-10-10 DIAGNOSIS — B029 Zoster without complications: Secondary | ICD-10-CM

## 2021-10-10 MED ORDER — VALACYCLOVIR HCL 1 G PO TABS: 1000.0000 mg | ORAL_TABLET | Freq: Three times a day (TID) | ORAL | 0 refills | Status: DC

## 2021-10-10 MED ORDER — PREDNISONE 10 MG (21) PO TBPK: ORAL_TABLET | Freq: Every day | ORAL | 0 refills | Status: DC

## 2021-10-10 NOTE — ED Provider Notes (Signed)
[facility]    CSN: [id number] Arrival date & time: 10/10/21  0903      History   Chief Complaint Chief Complaint  Patient presents with   Rash    HPI Erica Saunders is a 51 y.o. female.   HPI Pleasant 51 year old female presents with itchy rash of left side upper back for 3 days.  Reports had issues several months ago and was diagnosed with suspected shingles.  Patient reports was evaluated here for viral rash on 06/12/21.  Patient was prescribed Valtrex and steroid Pred Unipak.  PMH significant for history of endometriosis, hypothyroidism, and migraine.  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   Irritable bowel syndrome 10/10/2006   Raynaud's disease 01/26/2016    Patient Active Problem List   Diagnosis Date Noted   Common migraine with intractable migraine 07/24/2019   Hypothyroidism 12/22/2016   Raynaud's disease 01/26/2016   DDD (degenerative disc disease), lumbar 01/26/2016   Headache 01/26/2016   Bilateral leg pain 06/06/2013   Asthmatic bronchitis 08/14/2012   Chest pain 08/14/2012   Dysphagia, unspecified(787.20) 03/14/2012   Rectal spasm 02/15/2012   Breast lump on left side at 6 o'clock position 02/15/2012   History of rectal bleeding 02/15/2012   Insomnia 02/15/2012   ANA positive    History of miscarriage    Hx of endometriosis    Sinusitis 06/29/2011   ERYTHROMELALGIA 04/22/2009   ALLERGIC RHINITIS CAUSE UNSPECIFIED 04/09/2008   ANXIETY DISORDER, GENERALIZED 10/10/2006   IRRITABLE BOWEL  SYNDROME 10/10/2006   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      Home Medications    Prior to Admission medications   Medication Sig Start Date End Date Taking? Authorizing Provider  predniSONE (STERAPRED UNI-PAK 21 TAB) 10 MG (21) TBPK tablet Take by mouth daily. Take 6 tabs by mouth daily  for 2 days, then 5 tabs for 2 days, then 4 tabs for 2 days, then 3 tabs for 2 days, 2 tabs for 2 days, then 1 tab by mouth daily for 2 days 10/10/21  Yes Eliezer Lofts, FNP  valACYclovir (VALTREX) 1000 MG tablet Take 1 tablet (1,000 mg total) by mouth 3 (three) times daily. 10/10/21  Yes Eliezer Lofts, FNP  ALPRAZolam Duanne Moron) 0.5 MG tablet 1/2 to 1 po if needed for anxiety up to tid 08/20/13   Panosh, Standley Brooking, MD  amitriptyline (ELAVIL) 25 MG tablet Take 1 tablet (25 mg total) by mouth at bedtime as needed for sleep. Due for physical exam 06/10/21   Dorothyann Peng, NP  BIOTIN PO Take by mouth.    [provider]  cetirizine Alethia Berthold)  10 MG tablet Take 10 mg by mouth daily.    [provider]  hydrOXYzine (ATARAX/VISTARIL) 10 MG tablet Take 10 mg by mouth at bedtime.    Darliss Ridgel, MD  Ibuprofen 200 MG CAPS Take by mouth. Four a day    [provider]  levothyroxine (SYNTHROID, LEVOTHROID) 25 MCG tablet Take 25 mcg by mouth daily before breakfast.  08/26/16   [provider]  linaclotide (LINZESS) 145 MCG CAPS capsule Linzess 145 mcg capsule    [provider]  Magnesium 200 MG TABS Take by mouth.    [provider]  OMEPRAZOLE PO Take by mouth.    [provider]  ondansetron (ZOFRAN-ODT) 8 MG disintegrating tablet ondansetron 8 mg disintegrating  tablet  DISSOLVE 1 TABLET ON THE TONGUE TWICE DAILY    [provider]  phenazopyridine (PYRIDIUM) 200 MG tablet Take 1 tablet (200 mg total) by mouth 3 (three) times daily as needed. 05/07/21   Shirline Frees, NP  PRESCRIPTION MEDICATION valium vaginal suppositories 10 mg to be placed daily 07/05/16   [provider]  promethazine (PHENERGAN) 25 MG tablet Take 25 mg by mouth every 4 (four) hours. 03/13/19   [provider]  rizatriptan (MAXALT-MLT) 10 MG disintegrating tablet Take 1 tablet (10 mg total) by mouth as needed for migraine. May repeat in 2 hours if needed 02/26/20   Glean Salvo, NP  sertraline (ZOLOFT) 100 MG tablet Take 100 mg by mouth every morning. 03/07/20   Darliss Ridgel, MD  spironolactone (ALDACTONE) 50 MG tablet spironolactone 50 mg tablet  Take 1 tablet every day by oral route.    [provider]    Family History Family History  Problem Relation Age of Onset   Anxiety disorder Mother    Memory loss Mother    Hyperlipidemia Mother    Other Father        polio   Lupus Brother    Colon cancer Paternal Grandfather    Diabetes Paternal Grandfather    Esophageal cancer Neg Hx    Stomach cancer Neg Hx    Rectal cancer Neg Hx     Social History Social History   Tobacco Use   Smoking status: Never   Smokeless tobacco: Never  Vaping Use   Vaping Use: Never used  Substance Use Topics   Alcohol use: Yes    Alcohol/week: 5.0 standard drinks of alcohol    Types: 5 Glasses of wine per week    Comment: weekly   Drug use: No     Allergies   Penicillins, Celexa [citalopram hydrobromide], Trazodone and nefazodone, Ceftin [cefuroxime], and Prozac [fluoxetine hcl]   Review of Systems Review of Systems  Skin:  Positive for rash.     Physical Exam Triage Vital Signs ED Triage Vitals [10/10/21 0914]  Enc Vitals Group     BP      Pulse      Resp      Temp      Temp src      SpO2      Weight 148 lb (67.1 kg)     Height  5\' 4"  (1.626 m)     Head Circumference      Peak Flow      Pain Score 2     Pain Loc      Pain Edu?      Excl. in GC?    No data found.  Updated Vital Signs BP 118/83  Pulse 69   Temp 97.9 F (36.6 C) (Oral)   Resp 18   Ht 5\' 4"  (1.626 m)   Wt 148 lb (67.1 kg)   SpO2 99%   BMI 25.40 kg/m      Physical Exam Vitals and nursing note reviewed.  Constitutional:      Appearance: Normal appearance. She is normal weight.  HENT:     Head: Normocephalic and atraumatic.     Mouth/Throat:     Mouth: Mucous membranes are moist.     Pharynx: Oropharynx is clear.  Eyes:     Extraocular Movements: Extraocular movements intact.     Conjunctiva/sclera: Conjunctivae normal.     Pupils: Pupils are equal, round, and reactive to light.  Cardiovascular:     Rate and Rhythm: Normal rate and regular rhythm.     Pulses: Normal pulses.     Heart sounds: Normal heart sounds. No murmur heard. Pulmonary:     Effort: Pulmonary effort is normal.     Breath sounds: Normal breath sounds. No wheezing, rhonchi or rales.  Musculoskeletal:     Cervical back: Normal range of motion and neck supple.  Skin:    General: Skin is warm and dry.     Comments: Left-sided mid back: Pruritic/painful erythematous grouped vesicular lesions noted, please see image insert below  Neurological:     General: No focal deficit present.     Mental Status: She is alert and oriented to person, place, and time.       UC Treatments / Results  Labs (all labs ordered are listed, but only abnormal results are displayed) Labs Reviewed - No data to display  EKG   Radiology No results found.  Procedures Procedures (including critical care time)  Medications Ordered in UC Medications - No data to display  Initial Impression / Assessment and Plan / UC Course  I have reviewed the triage vital signs and the nursing notes.  Pertinent labs & imaging results that were available during my care of the patient were  reviewed by me and considered in my medical decision making (see chart for details).     MDM: 1.  Herpes zoster without complication-Rx'd Valtrex, Sterapred Unipak. Instructed patient to take medication as directed with food to completion.  Advised patient to take prednisone with first dose of Valtrex for the next 7 of 10 days.  Encouraged patient to take Valtrex every 4 hours today.  Advised beginning tomorrow Monday, 10/11/2021, please take Valtrex at 7 AM, 11 AM, and 3 PM, including prednisone with first dose.  Encouraged patient to increase daily water intake while taking these medications.  Advised patient if symptoms worsen and/or unresolved please follow-up with PCP or here for further evaluation.  Patient discharged home, hemodynamically stable.  Final Clinical Impressions(s) / UC Diagnoses   Final diagnoses:  Herpes zoster without complication     Discharge Instructions      Instructed patient to take medication as directed with food to completion.  Advised patient to take prednisone with first dose of Valtrex for the next 7 of 10 days.  Encouraged patient to take Valtrex every 4 hours today.  Advised beginning tomorrow Monday, 10/11/2021, please take Valtrex at 7 AM, 11 AM, and 3 PM, including prednisone with first dose.  Encouraged patient to increase daily water intake while taking these medications.  Advised patient if symptoms worsen and/or unresolved please follow-up with PCP or here for further evaluation.     ED Prescriptions  Medication Sig Dispense Auth. Provider   valACYclovir (VALTREX) 1000 MG tablet Take 1 tablet (1,000 mg total) by mouth 3 (three) times daily. 21 tablet Trevor Iha, FNP   predniSONE (STERAPRED UNI-PAK 21 TAB) 10 MG (21) TBPK tablet Take by mouth daily. Take 6 tabs by mouth daily  for 2 days, then 5 tabs for 2 days, then 4 tabs for 2 days, then 3 tabs for 2 days, 2 tabs for 2 days, then 1 tab by mouth daily for 2 days 42 tablet Trevor Iha, FNP       PDMP not reviewed this encounter.   Trevor Iha, FNP 10/10/21 [phone]

## 2021-10-10 NOTE — Discharge Instructions (Addendum)
Instructed patient to take medication as directed with food to completion.  Advised patient to take prednisone with first dose of Valtrex for the next 7 of 10 days.  Encouraged patient to take Valtrex every 4 hours today.  Advised beginning tomorrow Monday, 10/11/2021, please take Valtrex at 7 AM, 11 AM, and 3 PM, including prednisone with first dose.  Encouraged patient to increase daily water intake while taking these medications.  Advised patient if symptoms worsen and/or unresolved please follow-up with PCP or here for further evaluation.

## 2021-10-10 NOTE — ED Triage Notes (Signed)
Pt presents to Urgent Care with c/o itchy rash to L side/upper back x 3 days. Reports she had this issue several months ago and was dx w/ suspected shingles.

## 2021-10-21 ENCOUNTER — Encounter: Payer: Self-pay | Admitting: Adult Health

## 2021-10-21 ENCOUNTER — Ambulatory Visit: Payer: Managed Care, Other (non HMO) | Admitting: Adult Health

## 2021-10-21 VITALS — BP 112/70 | HR 72 | Temp 98.6°F | Ht 64.0 in | Wt 153.0 lb

## 2021-10-21 DIAGNOSIS — B029 Zoster without complications: Secondary | ICD-10-CM

## 2021-10-21 DIAGNOSIS — Z114 Encounter for screening for human immunodeficiency virus [HIV]: Secondary | ICD-10-CM

## 2021-10-21 DIAGNOSIS — R509 Fever, unspecified: Secondary | ICD-10-CM

## 2021-10-21 DIAGNOSIS — Z1159 Encounter for screening for other viral diseases: Secondary | ICD-10-CM

## 2021-10-21 LAB — CBC WITH DIFFERENTIAL/PLATELET
Basophils Absolute: 0.1 10*3/uL (ref 0.0–0.1)
Basophils Relative: 1 % (ref 0.0–3.0)
Eosinophils Absolute: 0.1 10*3/uL (ref 0.0–0.7)
Eosinophils Relative: 1.5 % (ref 0.0–5.0)
HCT: 42.9 % (ref 36.0–46.0)
Hemoglobin: 14.2 g/dL (ref 12.0–15.0)
Lymphocytes Relative: 29.1 % (ref 12.0–46.0)
Lymphs Abs: 1.5 10*3/uL (ref 0.7–4.0)
MCHC: 33.2 g/dL (ref 30.0–36.0)
MCV: 88.4 fl (ref 78.0–100.0)
Monocytes Absolute: 0.4 10*3/uL (ref 0.1–1.0)
Monocytes Relative: 8.6 % (ref 3.0–12.0)
Neutro Abs: 3.1 10*3/uL (ref 1.4–7.7)
Neutrophils Relative %: 59.8 % (ref 43.0–77.0)
Platelets: 329 10*3/uL (ref 150.0–400.0)
RBC: 4.86 Mil/uL (ref 3.87–5.11)
RDW: 13.8 % (ref 11.5–15.5)
WBC: 5.1 10*3/uL (ref 4.0–10.5)

## 2021-10-21 LAB — BASIC METABOLIC PANEL
BUN: 23 mg/dL (ref 6–23)
CO2: 28 mEq/L (ref 19–32)
Calcium: 10.3 mg/dL (ref 8.4–10.5)
Chloride: 99 mEq/L (ref 96–112)
Creatinine, Ser: 0.94 mg/dL (ref 0.40–1.20)
GFR: 71.05 mL/min (ref >60.00)
Glucose, Bld: 89 mg/dL (ref 70–99)
Potassium: 4.8 mEq/L (ref 3.5–5.1)
Sodium: 135 mEq/L (ref 135–145)

## 2021-10-21 NOTE — Progress Notes (Signed)
Subjective:    Patient ID: Erica Saunders, female    DOB: [birth year], 51 y.o.   MRN: [id number]  HPI 51 year old female who  has a past medical history of ALLERGIC RHINITIS CAUSE UNSPECIFIED (04/09/2008), ANA positive, Anxiety, ANXIETY DISORDER, GENERALIZED (10/10/2006), Common migraine with intractable migraine (07/24/2019), DDD (degenerative disc disease), lumbar (01/26/2016), Erythromelalgia ([facility]) (04/22/2009), Erythromelia, Headache (01/26/2016), History of miscarriage, endometriosis, INTERSTITIAL CYSTITIS (10/10/2006), Irritable bowel syndrome (10/10/2006), and Raynaud's disease (01/26/2016).  She was seen 11 days ago at urgent care when she presented with a itchy rash on her left side/upper back for 3 days.  She was diagnosed with herpes zoster without complication and prescribed Valtrex and steroid pack.  Previous to that visit she was seen at urgent care on 06/12/2021 for a rash on her left flank that was painful.  There was a cluster of blisters that was enlarging over time.  She was diagnosed with suspected shingles at this time as well and prescribed Valtrex  Today she reports that over the last couple of weeks she has not felt well, she has had on and off low grade fevers, mild body aches, and fatigue.   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   Irritable bowel syndrome 10/10/2006   Raynaud's disease 01/26/2016    Social History   Socioeconomic History   Marital status: Divorced    Spouse name: Merchant navy officer    Number of children: 1   Years of education: 19   Highest education level: Not on file  Occupational History    Employer: [employer]  Tobacco Use   Smoking status: Never   Smokeless tobacco: Never  Vaping Use   Vaping Use: Never used  Substance and Sexual Activity   Alcohol use: Yes    Alcohol/week: 5.0 standard drinks of alcohol    Types: 5 Glasses of wine per week    Comment: weekly   Drug use: No   Sexual activity: Not on file  Other Topics Concern   Not on file  Social History Narrative   Patient is married Government social research officer)   Wine at night max 1    Neg td    caffiene in am    Employed.    MSW    Patient has one child.   Patient works full-time, Presenter, broadcasting.   Patient has her Masters.   Social Determinants of Health   Financial Resource Strain: Not on file  Food Insecurity: Not on file  Transportation Needs: Not on file  Physical Activity: Not on file  Stress: Not on file  Social Connections: Not on file  Intimate Partner Violence: Not on file    Past Surgical History:  Procedure Laterality Date   ABDOMINAL HYSTERECTOMY     APPENDECTOMY     CESAREAN SECTION     CHOLECYSTECTOMY     DILATION AND CURETTAGE OF UTERUS     LAPAROSCOPY     endometriosis   TONSILLECTOMY AND  ADENOIDECTOMY      Family History  Problem Relation Age of Onset   Anxiety disorder Mother    Memory loss Mother    Hyperlipidemia Mother    Other Father        polio   Lupus Brother    Colon cancer Paternal Grandfather    Diabetes Paternal Grandfather    Esophageal cancer Neg Hx    Stomach cancer Neg Hx    Rectal cancer Neg Hx     Allergies  Allergen Reactions   Penicillins Anaphylaxis    REACTION: rash---red face   Celexa [Citalopram Hydrobromide]     Tic as side effect   Trazodone And Nefazodone Other (See Comments)    Excess sedation   Ceftin [Cefuroxime] Nausea Only   Prozac [Fluoxetine Hcl] Hives    Can take paxil     Current Outpatient Medications on File  Prior to Visit  Medication Sig Dispense Refill   ALPRAZolam (XANAX) 0.5 MG tablet 1/2 to 1 po if needed for anxiety up to tid 30 tablet 2   amitriptyline (ELAVIL) 25 MG tablet Take 1 tablet (25 mg total) by mouth at bedtime as needed for sleep. Due for physical exam 90 tablet 1   BIOTIN PO Take by mouth.     cetirizine (ZYRTEC) 10 MG tablet Take 10 mg by mouth daily.     hydrOXYzine (ATARAX/VISTARIL) 10 MG tablet Take 10 mg by mouth at bedtime.     Ibuprofen 200 MG CAPS Take by mouth. Four a day     levothyroxine (SYNTHROID, LEVOTHROID) 25 MCG tablet Take 25 mcg by mouth daily before breakfast.   6   linaclotide (LINZESS) 145 MCG CAPS capsule Linzess 145 mcg capsule     Magnesium 200 MG TABS Take by mouth.     OMEPRAZOLE PO Take by mouth.     ondansetron (ZOFRAN-ODT) 8 MG disintegrating tablet ondansetron 8 mg disintegrating tablet  DISSOLVE 1 TABLET ON THE TONGUE TWICE DAILY     phenazopyridine (PYRIDIUM) 200 MG tablet Take 1 tablet (200 mg total) by mouth 3 (three) times daily as needed. 30 tablet 3   predniSONE (STERAPRED UNI-PAK 21 TAB) 10 MG (21) TBPK tablet Take by mouth daily. Take 6 tabs by mouth daily  for 2 days, then 5 tabs for 2 days, then 4 tabs for 2 days, then 3 tabs for 2 days, 2 tabs for 2 days, then 1 tab by mouth daily for 2 days 42 tablet 0   PRESCRIPTION MEDICATION valium vaginal suppositories 10 mg to be placed daily     promethazine (PHENERGAN) 25 MG tablet Take 25 mg by mouth every 4 (four) hours.     rizatriptan (MAXALT-MLT) 10 MG disintegrating tablet Take 1 tablet (10 mg total) by mouth as needed for migraine. May repeat in 2 hours if needed 9 tablet 11   sertraline (ZOLOFT) 100 MG tablet Take 100 mg by mouth every morning.     spironolactone (ALDACTONE) 50 MG tablet spironolactone 50 mg tablet  Take 1 tablet every day by oral route.     valACYclovir (VALTREX) 1000 MG tablet Take 1 tablet (1,000 mg total) by mouth 3 (three) times daily. 21 tablet 0   No current  facility-administered medications on file prior to visit.    There were no vitals taken for this visit.      Objective:   Physical Exam Vitals and nursing note reviewed.  Constitutional:      General: She is not in acute  distress.    Appearance: Normal appearance. She is well-developed. She is not ill-appearing.  HENT:     Head: Normocephalic and atraumatic.     Right Ear: Tympanic membrane, ear canal and external ear normal. There is no impacted cerumen.     Left Ear: Tympanic membrane, ear canal and external ear normal. There is no impacted cerumen.     Nose: Nose normal. No congestion or rhinorrhea.     Mouth/Throat:     Mouth: Mucous membranes are moist.     Pharynx: Oropharynx is clear. No oropharyngeal exudate or posterior oropharyngeal erythema.  Eyes:     General:        Right eye: No discharge.        Left eye: No discharge.     Extraocular Movements: Extraocular movements intact.     Conjunctiva/sclera: Conjunctivae normal.     Pupils: Pupils are equal, round, and reactive to light.  Neck:     Thyroid: No thyromegaly.     Vascular: No carotid bruit.     Trachea: No tracheal deviation.  Cardiovascular:     Rate and Rhythm: Normal rate and regular rhythm.     Pulses: Normal pulses.     Heart sounds: Normal heart sounds. No murmur heard.    No friction rub. No gallop.  Pulmonary:     Effort: Pulmonary effort is normal. No respiratory distress.     Breath sounds: Normal breath sounds. No stridor. No wheezing, rhonchi or rales.  Chest:     Chest wall: No tenderness.  Abdominal:     General: Abdomen is flat. Bowel sounds are normal. There is no distension.     Palpations: Abdomen is soft. There is no mass.     Tenderness: There is no abdominal tenderness. There is no right CVA tenderness, left CVA tenderness, guarding or rebound.     Hernia: No hernia is present.  Musculoskeletal:        General: No swelling, tenderness, deformity or signs of injury. Normal range  of motion.     Cervical back: Normal range of motion and neck supple.     Right lower leg: No edema.     Left lower leg: No edema.  Lymphadenopathy:     Cervical: No cervical adenopathy.  Skin:    General: Skin is warm and dry.     Coloration: Skin is not jaundiced or pale.     Findings: No bruising, erythema, lesion or rash.  Neurological:     General: No focal deficit present.     Mental Status: She is alert and oriented to person, place, and time.     Cranial Nerves: No cranial nerve deficit.     Sensory: No sensory deficit.     Motor: No weakness.     Coordination: Coordination normal.     Gait: Gait normal.     Deep Tendon Reflexes: Reflexes normal.  Psychiatric:        Mood and Affect: Mood normal.        Behavior: Behavior normal.        Thought Content: Thought content normal.        Judgment: Judgment normal.       Assessment & Plan:  1. Herpes zoster without complication - Appear healed. 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 May be contributing to fevers and not feeling well  - Encouraged shingles vaccination when she turns 51  - CBC with Differential/Platelet; Future -  Basic Metabolic Panel; Future  2. Fever, unspecified fever cause  - CBC with Differential/Platelet; Future - Basic Metabolic Panel; Future  3. Need for hepatitis C screening test  - Hep C Antibody; Future  4. Encounter for screening for HIV  - HIV Antibody (routine testing w rflx); Future  Shirline Frees, NP

## 2021-10-22 LAB — HEPATITIS C ANTIBODY: Hepatitis C Ab: NONREACTIVE

## 2021-10-22 LAB — HIV ANTIBODY (ROUTINE TESTING W REFLEX): HIV 1&2 Ab, 4th Generation: NONREACTIVE

## 2021-10-24 ENCOUNTER — Encounter: Payer: Self-pay | Admitting: Adult Health

## 2021-10-26 ENCOUNTER — Telehealth: Payer: Managed Care, Other (non HMO) | Admitting: Physician Assistant

## 2021-10-26 DIAGNOSIS — J019 Acute sinusitis, unspecified: Secondary | ICD-10-CM

## 2021-10-26 DIAGNOSIS — B9689 Other specified bacterial agents as the cause of diseases classified elsewhere: Secondary | ICD-10-CM | POA: Diagnosis not present

## 2021-10-26 DIAGNOSIS — R051 Acute cough: Secondary | ICD-10-CM | POA: Diagnosis not present

## 2021-10-26 MED ORDER — AZITHROMYCIN 250 MG PO TABS: ORAL_TABLET | ORAL | 0 refills | Status: AC

## 2021-10-26 MED ORDER — PROMETHAZINE-DM 6.25-15 MG/5ML PO SYRP: 5.0000 mL | ORAL_SOLUTION | Freq: Four times a day (QID) | ORAL | 0 refills | Status: DC | PRN

## 2021-10-26 MED ORDER — BENZONATATE 100 MG PO CAPS: 100.0000 mg | ORAL_CAPSULE | Freq: Three times a day (TID) | ORAL | 0 refills | Status: DC | PRN

## 2021-10-26 NOTE — Progress Notes (Signed)
Virtual Visit Consent   Posey Boyer, you are scheduled for a virtual visit with a [facility] provider today. Just as with appointments in the office, your consent must be obtained to participate. Your consent will be active for this visit and any virtual visit you may have with one of our providers in the next 365 days. If you have a MyChart account, a copy of this consent can be sent to you electronically.  As this is a virtual visit, video technology does not allow for your provider to perform a traditional examination. This may limit your provider's ability to fully assess your condition. If your provider identifies any concerns that need to be evaluated in person or the need to arrange testing (such as labs, EKG, etc.), we will make arrangements to do so. Although advances in technology are sophisticated, we cannot ensure that it will always work on either your end or our end. If the connection with a video visit is poor, the visit may have to be switched to a telephone visit. With either a video or telephone visit, we are not always able to ensure that we have a secure connection.  By engaging in this virtual visit, you consent to the provision of healthcare and authorize for your insurance to be billed (if applicable) for the services provided during this visit. Depending on your insurance coverage, you may receive a charge related to this service.  I need to obtain your verbal consent now. Are you willing to proceed with your visit today? Shamon A Dilday has provided verbal consent on 10/26/2021 for a virtual visit (video or telephone). [name], PA-C  Date: 10/26/2021 9:32 AM  Virtual Visit via Video Note   I, [name], connected with  MARRI MCNEFF  ([id number], November 09, 1970) on 10/26/21 at  9:30 AM EDT by a video-enabled telemedicine application and verified that I am speaking with the correct person using two identifiers.  Location: Patient: Virtual Visit  Location Patient: Home Provider: Virtual Visit Location Provider: Home Office   I discussed the limitations of evaluation and management by telemedicine and the availability of in person appointments. The patient expressed understanding and agreed to proceed.    History of Present Illness: KENZLEY KE is a 51 y.o. who identifies as a female who was assigned female at birth, and is being seen today for possible sinus infection with cough.  HPI: Sinusitis This is a new problem. The current episode started 1 to 4 weeks ago. The problem has been gradually worsening since onset. Maximum temperature: subjective fevers. Associated symptoms include congestion, coughing, ear pain (left), headaches, sinus pressure and a sore throat. Past treatments include acetaminophen (zyrtec-d). The treatment provided no relief.      Problems:  Patient Active Problem List   Diagnosis Date Noted   Common migraine with intractable migraine 07/24/2019   Hypothyroidism 12/22/2016   Raynaud's disease 01/26/2016   DDD (degenerative disc disease), lumbar 01/26/2016   Headache 01/26/2016   Bilateral leg pain 06/06/2013   Asthmatic bronchitis 08/14/2012   Chest pain 08/14/2012   Dysphagia, unspecified(787.20) 03/14/2012   Rectal spasm 02/15/2012   Breast lump on left side at 6 o'clock position 02/15/2012   History of rectal bleeding 02/15/2012   Insomnia 02/15/2012   ANA positive    History of miscarriage    Hx of endometriosis    Sinusitis 06/29/2011   ERYTHROMELALGIA 04/22/2009   ALLERGIC RHINITIS CAUSE UNSPECIFIED 04/09/2008   ANXIETY DISORDER, GENERALIZED 10/10/2006  IRRITABLE BOWEL SYNDROME 10/10/2006   INTERSTITIAL CYSTITIS 10/10/2006    Allergies:  Allergies  Allergen Reactions   Penicillins Anaphylaxis    REACTION: rash---red face   Celexa [Citalopram Hydrobromide]     Tic as side effect   Sulfa Antibiotics Nausea Only   Trazodone And Nefazodone Other (See Comments)    Excess sedation    Ceftin [Cefuroxime] Nausea Only   Prozac [Fluoxetine Hcl] Hives    Can take paxil    Medications:  Current Outpatient Medications:    azithromycin (ZITHROMAX) 250 MG tablet, Take 2 tablets on day 1, then 1 tablet daily on days 2 through 5, Disp: 6 tablet, Rfl: 0   benzonatate (TESSALON) 100 MG capsule, Take 1 capsule (100 mg total) by mouth 3 (three) times daily as needed., Disp: 30 capsule, Rfl: 0   promethazine-dextromethorphan (PROMETHAZINE-DM) 6.25-15 MG/5ML syrup, Take 5 mLs by mouth 4 (four) times daily as needed., Disp: 118 mL, Rfl: 0   ALPRAZolam (XANAX) 0.5 MG tablet, 1/2 to 1 po if needed for anxiety up to tid, Disp: 30 tablet, Rfl: 2   BIOTIN PO, Take by mouth., Disp: , Rfl:    cetirizine (ZYRTEC) 10 MG tablet, Take 10 mg by mouth daily., Disp: , Rfl:    hydrOXYzine (ATARAX/VISTARIL) 10 MG tablet, Take 10 mg by mouth at bedtime., Disp: , Rfl:    hydrOXYzine (VISTARIL) 25 MG capsule, Take 1 capsule by mouth at bedtime as needed., Disp: , Rfl:    Ibuprofen 200 MG CAPS, Take by mouth as needed. PRN, Disp: , Rfl:    levothyroxine (SYNTHROID, LEVOTHROID) 25 MCG tablet, Take 25 mcg by mouth daily before breakfast. , Disp: , Rfl: 6   linaclotide (LINZESS) 145 MCG CAPS capsule, Linzess 145 mcg capsule, Disp: , Rfl:    Magnesium 200 MG TABS, Take by mouth., Disp: , Rfl:    Meth-Hyo-M Bl-Na Phos-Ph Sal (URIBEL) 118 MG CAPS, Take 1 capsule 4 times a day by oral route., Disp: , Rfl:    OMEPRAZOLE PO, Take by mouth., Disp: , Rfl:    phenazopyridine (PYRIDIUM) 200 MG tablet, Take 1 tablet (200 mg total) by mouth 3 (three) times daily as needed., Disp: 30 tablet, Rfl: 3   PRESCRIPTION MEDICATION, valium vaginal suppositories 10 mg to be placed daily, Disp: , Rfl:    promethazine (PHENERGAN) 25 MG tablet, Take 25 mg by mouth every 4 (four) hours., Disp: , Rfl:    sertraline (ZOLOFT) 100 MG tablet, Take 100 mg by mouth every morning., Disp: , Rfl:    spironolactone (ALDACTONE) 50 MG tablet,  spironolactone 50 mg tablet  Take 1 tablet every day by oral route., Disp: , Rfl:   Observations/Objective: Patient is well-developed, well-nourished in no acute distress.  Resting comfortably at home.  Head is normocephalic, atraumatic.  No labored breathing.  Speech is clear and coherent with logical content.  Patient is alert and oriented at baseline.    Assessment and Plan: 1. Acute bacterial sinusitis - azithromycin (ZITHROMAX) 250 MG tablet; Take 2 tablets on day 1, then 1 tablet daily on days 2 through 5  Dispense: 6 tablet; Refill: 0  2. Acute cough - promethazine-dextromethorphan (PROMETHAZINE-DM) 6.25-15 MG/5ML syrup; Take 5 mLs by mouth 4 (four) times daily as needed.  Dispense: 118 mL; Refill: 0 - benzonatate (TESSALON) 100 MG capsule; Take 1 capsule (100 mg total) by mouth 3 (three) times daily as needed.  Dispense: 30 capsule; Refill: 0  - Worsening symptoms that have not responded to  OTC medications.  - Will give Azithromycin, Promethazine DM, and Tessalon perles - Continue allergy medications.  - Steam and humidifier can help - Stay well hydrated and get plenty of rest.  - Seek in person evaluation if no symptom improvement or if symptoms worsen   Follow Up Instructions: I discussed the assessment and treatment plan with the patient. The patient was provided an opportunity to ask questions and all were answered. The patient agreed with the plan and demonstrated an understanding of the instructions.  A copy of instructions were sent to the patient via MyChart unless otherwise noted below.    The patient was advised to call back or seek an in-person evaluation if the symptoms worsen or if the condition fails to improve as anticipated.  Time:  I spent 8 minutes with the patient via telehealth technology discussing the above problems/concerns.    [name], PA-C

## 2021-10-26 NOTE — Patient Instructions (Signed)
Erica Saunders, thank you for joining Erica Loveless, PA-C for today's virtual visit.  While this provider is not your primary care provider (PCP), if your PCP is located in our provider database this encounter information will be shared with them immediately following your visit.  Consent: (Patient) Erica Saunders provided verbal consent for this virtual visit at the beginning of the encounter.  Current Medications:  Current Outpatient Medications:    azithromycin (ZITHROMAX) 250 MG tablet, Take 2 tablets on day 1, then 1 tablet daily on days 2 through 5, Disp: 6 tablet, Rfl: 0   benzonatate (TESSALON) 100 MG capsule, Take 1 capsule (100 mg total) by mouth 3 (three) times daily as needed., Disp: 30 capsule, Rfl: 0   promethazine-dextromethorphan (PROMETHAZINE-DM) 6.25-15 MG/5ML syrup, Take 5 mLs by mouth 4 (four) times daily as needed., Disp: 118 mL, Rfl: 0   ALPRAZolam (XANAX) 0.5 MG tablet, 1/2 to 1 po if needed for anxiety up to tid, Disp: 30 tablet, Rfl: 2   BIOTIN PO, Take by mouth., Disp: , Rfl:    cetirizine (ZYRTEC) 10 MG tablet, Take 10 mg by mouth daily., Disp: , Rfl:    hydrOXYzine (ATARAX/VISTARIL) 10 MG tablet, Take 10 mg by mouth at bedtime., Disp: , Rfl:    hydrOXYzine (VISTARIL) 25 MG capsule, Take 1 capsule by mouth at bedtime as needed., Disp: , Rfl:    Ibuprofen 200 MG CAPS, Take by mouth as needed. PRN, Disp: , Rfl:    levothyroxine (SYNTHROID, LEVOTHROID) 25 MCG tablet, Take 25 mcg by mouth daily before breakfast. , Disp: , Rfl: 6   linaclotide (LINZESS) 145 MCG CAPS capsule, Linzess 145 mcg capsule, Disp: , Rfl:    Magnesium 200 MG TABS, Take by mouth., Disp: , Rfl:    Meth-Hyo-M Bl-Na Phos-Ph Sal (URIBEL) 118 MG CAPS, Take 1 capsule 4 times a day by oral route., Disp: , Rfl:    OMEPRAZOLE PO, Take by mouth., Disp: , Rfl:    phenazopyridine (PYRIDIUM) 200 MG tablet, Take 1 tablet (200 mg total) by mouth 3 (three) times daily as needed., Disp: 30 tablet, Rfl:  3   PRESCRIPTION MEDICATION, valium vaginal suppositories 10 mg to be placed daily, Disp: , Rfl:    promethazine (PHENERGAN) 25 MG tablet, Take 25 mg by mouth every 4 (four) hours., Disp: , Rfl:    sertraline (ZOLOFT) 100 MG tablet, Take 100 mg by mouth every morning., Disp: , Rfl:    spironolactone (ALDACTONE) 50 MG tablet, spironolactone 50 mg tablet  Take 1 tablet every day by oral route., Disp: , Rfl:    Medications ordered in this encounter:  Meds ordered this encounter  Medications   azithromycin (ZITHROMAX) 250 MG tablet    Sig: Take 2 tablets on day 1, then 1 tablet daily on days 2 through 5    Dispense:  6 tablet    Refill:  0    Order Specific Question:   Supervising Provider    Answer:   Eber Hong [[id number]]   promethazine-dextromethorphan (PROMETHAZINE-DM) 6.25-15 MG/5ML syrup    Sig: Take 5 mLs by mouth 4 (four) times daily as needed.    Dispense:  118 mL    Refill:  0    Order Specific Question:   Supervising Provider    Answer:   MILLER, BRIAN [[id number]]   benzonatate (TESSALON) 100 MG capsule    Sig: Take 1 capsule (100 mg total) by mouth 3 (three) times daily as needed.  Dispense:  30 capsule    Refill:  0    Order Specific Question:   Supervising Provider    Answer:   Hyacinth Meeker, BRIAN [[id number]]     *If you need refills on other medications prior to your next appointment, please contact your pharmacy*  Follow-Up: Call back or seek an in-person evaluation if the symptoms worsen or if the condition fails to improve as anticipated.  Other Instructions Sinus Infection, Adult A sinus infection, also called sinusitis, is inflammation of your sinuses. Sinuses are hollow spaces in the bones around your face. Your sinuses are located: Around your eyes. In the middle of your forehead. Behind your nose. In your cheekbones. Mucus normally drains out of your sinuses. When your nasal tissues become inflamed or swollen, mucus can become trapped or blocked. This allows bacteria,  viruses, and fungi to grow, which leads to infection. Most infections of the sinuses are caused by a virus. A sinus infection can develop quickly. It can last for up to 4 weeks (acute) or for more than 12 weeks (chronic). A sinus infection often develops after a cold. What are the causes? This condition is caused by anything that creates swelling in the sinuses or stops mucus from draining. This includes: Allergies. Asthma. Infection from bacteria or viruses. Deformities or blockages in your nose or sinuses. Abnormal growths in the nose (nasal polyps). Pollutants, such as chemicals or irritants in the air. Infection from fungi. This is rare. What increases the risk? You are more likely to develop this condition if you: Have a weak body defense system (immune system). Do a lot of swimming or diving. Overuse nasal sprays. Smoke. What are the signs or symptoms? The main symptoms of this condition are pain and a feeling of pressure around the affected sinuses. Other symptoms include: Stuffy nose or congestion that makes it difficult to breathe through your nose. Thick yellow or greenish drainage from your nose. Tenderness, swelling, and warmth over the affected sinuses. A cough that may get worse at night. Decreased sense of smell and taste. Extra mucus that collects in the throat or the back of the nose (postnasal drip) causing a sore throat or bad breath. Tiredness (fatigue). Fever. How is this diagnosed? This condition is diagnosed based on: Your symptoms. Your medical history. A physical exam. Tests to find out if your condition is acute or chronic. This may include: Checking your nose for nasal polyps. Viewing your sinuses using a device that has a light (endoscope). Testing for allergies or bacteria. Imaging tests, such as an MRI or CT scan. In rare cases, a bone biopsy may be done to rule out more serious types of fungal sinus disease. How is this treated? Treatment for a  sinus infection depends on the cause and whether your condition is chronic or acute. If caused by a virus, your symptoms should go away on their own within 10 days. You may be given medicines to relieve symptoms. They include: Medicines that shrink swollen nasal passages (decongestants). A spray that eases inflammation of the nostrils (topical intranasal corticosteroids). Rinses that help get rid of thick mucus in your nose (nasal saline washes). Medicines that treat allergies (antihistamines). Over-the-counter pain relievers. If caused by bacteria, your health care provider may recommend waiting to see if your symptoms improve. Most bacterial infections will get better without antibiotic medicine. You may be given antibiotics if you have: A severe infection. A weak immune system. If caused by narrow nasal passages or nasal polyps, surgery may  be needed. Follow these instructions at home: Medicines Take, use, or apply over-the-counter and prescription medicines only as told by your health care provider. These may include nasal sprays. If you were prescribed an antibiotic medicine, take it as told by your health care provider. Do not stop taking the antibiotic even if you start to feel better. Hydrate and humidify  Drink enough fluid to keep your urine pale yellow. Staying hydrated will help to thin your mucus. Use a cool mist humidifier to keep the humidity level in your home above 50%. Inhale steam for 10-15 minutes, 3-4 times a day, or as told by your health care provider. You can do this in the bathroom while a hot shower is running. Limit your exposure to cool or dry air. Rest Rest as much as possible. Sleep with your head raised (elevated). Make sure you get enough sleep each night. General instructions  Apply a warm, moist washcloth to your face 3-4 times a day or as told by your health care provider. This will help with discomfort. Use nasal saline washes as often as told by your  health care provider. Wash your hands often with soap and water to reduce your exposure to germs. If soap and water are not available, use hand sanitizer. Do not smoke. Avoid being around people who are smoking (secondhand smoke). Keep all follow-up visits. This is important. Contact a health care provider if: You have a fever. Your symptoms get worse. Your symptoms do not improve within 10 days. Get help right away if: You have a severe headache. You have persistent vomiting. You have severe pain or swelling around your face or eyes. You have vision problems. You develop confusion. Your neck is stiff. You have trouble breathing. These symptoms may be an emergency. Get help right away. Call 911. Do not wait to see if the symptoms will go away. Do not drive yourself to the hospital. Summary A sinus infection is soreness and inflammation of your sinuses. Sinuses are hollow spaces in the bones around your face. This condition is caused by nasal tissues that become inflamed or swollen. The swelling traps or blocks the flow of mucus. This allows bacteria, viruses, and fungi to grow, which leads to infection. If you were prescribed an antibiotic medicine, take it as told by your health care provider. Do not stop taking the antibiotic even if you start to feel better. Keep all follow-up visits. This is important. This information is not intended to replace advice given to you by your health care provider. Make sure you discuss any questions you have with your health care provider. Document Revised: 02/16/2021 Document Reviewed: 02/16/2021 Elsevier Patient Education  2023 [employer]    If you have been instructed to have an in-person evaluation today at a local Urgent Care facility, please use the link below. It will take you to a list of all of our available [facility], including address, phone number and hours of operation. Please do not delay care.  [facility]  If you or a family member do not have a primary care provider, use the link below to schedule a visit and establish care. When you choose a [facility] primary care physician or advanced practice provider, you gain a long-term partner in health. Find a Primary Care Provider  Learn more about [facility] in-office and virtual care options: [facility] - Get Care Now

## 2021-11-29 ENCOUNTER — Encounter: Payer: Self-pay | Admitting: Adult Health

## 2021-12-01 ENCOUNTER — Ambulatory Visit: Payer: Managed Care, Other (non HMO) | Admitting: Adult Health

## 2021-12-01 VITALS — BP 104/80 | HR 77 | Temp 97.9°F | Ht 64.25 in | Wt 152.2 lb

## 2021-12-01 DIAGNOSIS — K581 Irritable bowel syndrome with constipation: Secondary | ICD-10-CM | POA: Diagnosis not present

## 2021-12-01 DIAGNOSIS — N3010 Interstitial cystitis (chronic) without hematuria: Secondary | ICD-10-CM

## 2021-12-01 DIAGNOSIS — G43019 Migraine without aura, intractable, without status migrainosus: Secondary | ICD-10-CM | POA: Diagnosis not present

## 2021-12-01 DIAGNOSIS — Z0000 Encounter for general adult medical examination without abnormal findings: Secondary | ICD-10-CM

## 2021-12-01 DIAGNOSIS — E039 Hypothyroidism, unspecified: Secondary | ICD-10-CM

## 2021-12-01 DIAGNOSIS — F411 Generalized anxiety disorder: Secondary | ICD-10-CM | POA: Diagnosis not present

## 2021-12-01 DIAGNOSIS — I7381 Erythromelalgia: Secondary | ICD-10-CM

## 2021-12-01 DIAGNOSIS — Z23 Encounter for immunization: Secondary | ICD-10-CM

## 2021-12-01 LAB — CBC WITH DIFFERENTIAL/PLATELET
Basophils Absolute: 0 10*3/uL (ref 0.0–0.1)
Basophils Relative: 1 % (ref 0.0–3.0)
Eosinophils Absolute: 0.1 10*3/uL (ref 0.0–0.7)
Eosinophils Relative: 1.6 % (ref 0.0–5.0)
HCT: 40.6 % (ref 36.0–46.0)
Hemoglobin: 13.6 g/dL (ref 12.0–15.0)
Lymphocytes Relative: 25 % (ref 12.0–46.0)
Lymphs Abs: 1.3 10*3/uL (ref 0.7–4.0)
MCHC: 33.5 g/dL (ref 30.0–36.0)
MCV: 87.8 fl (ref 78.0–100.0)
Monocytes Absolute: 0.4 10*3/uL (ref 0.1–1.0)
Monocytes Relative: 8.3 % (ref 3.0–12.0)
Neutro Abs: 3.3 10*3/uL (ref 1.4–7.7)
Neutrophils Relative %: 64.1 % (ref 43.0–77.0)
Platelets: 316 10*3/uL (ref 150.0–400.0)
RBC: 4.62 Mil/uL (ref 3.87–5.11)
RDW: 14.3 % (ref 11.5–15.5)
WBC: 5.1 10*3/uL (ref 4.0–10.5)

## 2021-12-01 LAB — LIPID PANEL
Cholesterol: 218 mg/dL — ABNORMAL HIGH (ref 0–200)
HDL: 95.9 mg/dL (ref >39.00)
LDL Cholesterol: 111 mg/dL — ABNORMAL HIGH (ref 0–99)
NonHDL: 122.53
Total CHOL/HDL Ratio: 2
Triglycerides: 60 mg/dL (ref 0.0–149.0)
VLDL: 12 mg/dL (ref 0.0–40.0)

## 2021-12-01 LAB — COMPREHENSIVE METABOLIC PANEL
ALT: 17 U/L (ref 0–35)
AST: 23 U/L (ref 0–37)
Albumin: 4.4 g/dL (ref 3.5–5.2)
Alkaline Phosphatase: 65 U/L (ref 39–117)
BUN: 15 mg/dL (ref 6–23)
CO2: 29 mEq/L (ref 19–32)
Calcium: 10 mg/dL (ref 8.4–10.5)
Chloride: 100 mEq/L (ref 96–112)
Creatinine, Ser: 0.9 mg/dL (ref 0.40–1.20)
GFR: 74.8 mL/min (ref >60.00)
Glucose, Bld: 87 mg/dL (ref 70–99)
Potassium: 4.4 mEq/L (ref 3.5–5.1)
Sodium: 136 mEq/L (ref 135–145)
Total Bilirubin: 0.7 mg/dL (ref 0.2–1.2)
Total Protein: 7.6 g/dL (ref 6.0–8.3)

## 2021-12-01 LAB — HEMOGLOBIN A1C: Hgb A1c MFr Bld: 5.5 % (ref 4.6–6.5)

## 2021-12-01 LAB — TSH: TSH: 2.09 u[IU]/mL (ref 0.35–5.50)

## 2021-12-01 MED ORDER — RIZATRIPTAN BENZOATE 10 MG PO TABS: 10.0000 mg | ORAL_TABLET | ORAL | 3 refills | Status: AC | PRN

## 2021-12-01 NOTE — Progress Notes (Signed)
Subjective:    Patient ID: Erica Saunders, female    DOB: 06/10/1970, 51 y.o.   MRN: [id number]  HPI Patient presents for yearly preventative medicine examination. She is a pleasant 51 year old female who  has a past medical history of ALLERGIC RHINITIS CAUSE UNSPECIFIED (04/09/2008), ANA positive, Anxiety, ANXIETY DISORDER, GENERALIZED (10/10/2006), Common migraine with intractable migraine (07/24/2019), DDD (degenerative disc disease), lumbar (01/26/2016), Erythromelalgia (HCC) (04/22/2009), Erythromelia, Headache (01/26/2016), History of miscarriage, endometriosis, INTERSTITIAL CYSTITIS (10/10/2006), Irritable bowel syndrome (10/10/2006), and Raynaud's disease (01/26/2016).  She will be moving back to [place] to be closer to family towards the end of October   Interstitial cystitis-managed by urology and GYN- takes pyridium 200 mg TID PRN. Symptoms worse with certain foods.   Anxiety-Zoloft 100 mg daily and Xanax 0.5 mg as needed ( rarely).  She does feel well controlled  Hypothyroidism-managed with Synthroid 25 mcg Lab Results  Component Value Date   TSH 1.79 10/03/2018   IBS-C -Not currently on medication. Uses Miralax PRN. She tried Linzess but felt this caused bloating   Erythromelalgia - managed by Rheumatology   Migraine headaches - has them infrequently. Uses Maxalt PRN. Has tried topamax in the past but this caused a foggy headed feeling  All immunizations and health maintenance protocols were reviewed with the patient and needed orders were placed.  Appropriate screening laboratory values were ordered for the patient including screening of hyperlipidemia, renal function and hepatic function.  Medication reconciliation,  past medical history, social history, problem list and allergies were reviewed in detail with the patient  Goals were established with regard to weight loss, exercise, and  diet in compliance with medications  Wt Readings from Last 3 Encounters:  10/21/21 153  lb (69.4 kg)  10/10/21 148 lb (67.1 kg)  06/12/21 150 lb (68 kg)    She is due for routine colon cancer screening in December 2023.  Her last colonoscopy was in December 2013. She is up to date on PAP and mammogram.   Review of Systems  Constitutional: Negative.   HENT: Negative.    Eyes: Negative.   Respiratory: Negative.    Cardiovascular: Negative.   Gastrointestinal:  Positive for constipation.  Endocrine: Negative.   Genitourinary:  Positive for frequency, pelvic pain and urgency.  Musculoskeletal: Negative.   Skin: Negative.   Allergic/Immunologic: Negative.   Neurological: Negative.   Hematological: Negative.   Psychiatric/Behavioral: Negative.     Past Medical History:  Diagnosis Date   ALLERGIC RHINITIS CAUSE UNSPECIFIED 04/09/2008   ANA positive    Anxiety    ANXIETY DISORDER, GENERALIZED 10/10/2006   hospitalized  about age 60 after child  wih panic episode   Common migraine with intractable migraine 07/24/2019   DDD (degenerative disc disease), lumbar 01/26/2016   Erythromelalgia (HCC) 04/22/2009   Erythromelia    Headache 01/26/2016   History of miscarriage    x 2    Hx of endometriosis    INTERSTITIAL CYSTITIS 10/10/2006   Irritable bowel syndrome 10/10/2006   Raynaud's disease 01/26/2016    Social History   Socioeconomic History   Marital status: Divorced    Spouse name: Merchant navy officer   Number of children: 1   Years of education: 19   Highest education level: Not on file  Occupational History    Employer: [employer]  Tobacco Use   Smoking status: Never   Smokeless tobacco: Never  Vaping Use   Vaping Use: Never used  Substance and Sexual Activity  Alcohol use: Yes    Alcohol/week: 5.0 standard drinks of alcohol    Types: 5 Glasses of wine per week    Comment: weekly   Drug use: No   Sexual activity: Not on file  Other Topics Concern   Not on file  Social History Narrative   Patient is married Government social research officer)   Wine at night max 1    Neg td     caffiene in am    Employed.    MSW    Patient has one child.   Patient works full-time, Presenter, broadcasting.   Patient has her Masters.   Social Determinants of Corporate investment banker Strain: Not on file  Food Insecurity: Not on file  Transportation Needs: Not on file  Physical Activity: Not on file  Stress: Not on file  Social Connections: Not on file  Intimate Partner Violence: Not on file    Past Surgical History:  Procedure Laterality Date   ABDOMINAL HYSTERECTOMY     APPENDECTOMY     CESAREAN SECTION     CHOLECYSTECTOMY     DILATION AND CURETTAGE OF UTERUS     LAPAROSCOPY     endometriosis   TONSILLECTOMY AND ADENOIDECTOMY      Family History  Problem Relation Age of Onset   Anxiety disorder Mother    Memory loss Mother    Hyperlipidemia Mother    Other Father        polio   Lupus Brother    Colon cancer Paternal Grandfather    Diabetes Paternal Grandfather    Esophageal cancer Neg Hx    Stomach cancer Neg Hx    Rectal cancer Neg Hx     Allergies  Allergen Reactions   Penicillins Anaphylaxis    REACTION: rash---red face   Celexa [Citalopram Hydrobromide]     Tic as side effect   Sulfa Antibiotics Nausea Only   Trazodone And Nefazodone Other (See Comments)    Excess sedation   Ceftin [Cefuroxime] Nausea Only   Prozac [Fluoxetine Hcl] Hives    Can take paxil     Current Outpatient Medications on File Prior to Visit  Medication Sig Dispense Refill   ALPRAZolam (XANAX) 0.5 MG tablet 1/2 to 1 po if needed for anxiety up to tid 30 tablet 2   benzonatate (TESSALON) 100 MG capsule Take 1 capsule (100 mg total) by mouth 3 (three) times daily as needed. 30 capsule 0   BIOTIN PO Take by mouth.     cetirizine (ZYRTEC) 10 MG tablet Take 10 mg by mouth daily.     hydrOXYzine (ATARAX/VISTARIL) 10 MG tablet Take 10 mg by mouth at bedtime.     hydrOXYzine (VISTARIL) 25 MG capsule Take 1 capsule by mouth at bedtime as needed.     Ibuprofen 200 MG CAPS Take by  mouth as needed. PRN     levothyroxine (SYNTHROID, LEVOTHROID) 25 MCG tablet Take 25 mcg by mouth daily before breakfast.   6   linaclotide (LINZESS) 145 MCG CAPS capsule Linzess 145 mcg capsule     Magnesium 200 MG TABS Take by mouth.     Meth-Hyo-M Bl-Na Phos-Ph Sal (URIBEL) 118 MG CAPS Take 1 capsule 4 times a day by oral route.     OMEPRAZOLE PO Take by mouth.     phenazopyridine (PYRIDIUM) 200 MG tablet Take 1 tablet (200 mg total) by mouth 3 (three) times daily as needed. 30 tablet 3   PRESCRIPTION MEDICATION valium vaginal suppositories 10 mg  to be placed daily     promethazine (PHENERGAN) 25 MG tablet Take 25 mg by mouth every 4 (four) hours.     promethazine-dextromethorphan (PROMETHAZINE-DM) 6.25-15 MG/5ML syrup Take 5 mLs by mouth 4 (four) times daily as needed. 118 mL 0   sertraline (ZOLOFT) 100 MG tablet Take 100 mg by mouth every morning.     spironolactone (ALDACTONE) 50 MG tablet spironolactone 50 mg tablet  Take 1 tablet every day by oral route.     No current facility-administered medications on file prior to visit.    There were no vitals taken for this visit.      Objective:   Physical Exam Vitals and nursing note reviewed.  Constitutional:      General: She is not in acute distress.    Appearance: Normal appearance. She is well-developed. She is not ill-appearing.  HENT:     Head: Normocephalic and atraumatic.     Right Ear: Tympanic membrane, ear canal and external ear normal. There is no impacted cerumen.     Left Ear: Tympanic membrane, ear canal and external ear normal. There is no impacted cerumen.     Nose: Nose normal. No congestion or rhinorrhea.     Mouth/Throat:     Mouth: Mucous membranes are moist.     Pharynx: Oropharynx is clear. No oropharyngeal exudate or posterior oropharyngeal erythema.  Eyes:     General:        Right eye: No discharge.        Left eye: No discharge.     Extraocular Movements: Extraocular movements intact.      Conjunctiva/sclera: Conjunctivae normal.     Pupils: Pupils are equal, round, and reactive to light.  Neck:     Thyroid: No thyromegaly.     Vascular: No carotid bruit.     Trachea: No tracheal deviation.  Cardiovascular:     Rate and Rhythm: Normal rate and regular rhythm.     Pulses: Normal pulses.     Heart sounds: Normal heart sounds. No murmur heard.    No friction rub. No gallop.  Pulmonary:     Effort: Pulmonary effort is normal. No respiratory distress.     Breath sounds: Normal breath sounds. No stridor. No wheezing, rhonchi or rales.  Chest:     Chest wall: No tenderness.  Abdominal:     General: Abdomen is flat. Bowel sounds are normal. There is no distension.     Palpations: Abdomen is soft. There is no mass.     Tenderness: There is abdominal tenderness in the suprapubic area. There is no right CVA tenderness, left CVA tenderness, guarding or rebound.     Hernia: No hernia is present.  Musculoskeletal:        General: No swelling, tenderness, deformity or signs of injury. Normal range of motion.     Cervical back: Normal range of motion and neck supple.     Right lower leg: No edema.     Left lower leg: No edema.  Lymphadenopathy:     Cervical: No cervical adenopathy.  Skin:    General: Skin is warm and dry.     Coloration: Skin is not jaundiced or pale.     Findings: No bruising, erythema, lesion or rash.  Neurological:     General: No focal deficit present.     Mental Status: She is alert and oriented to person, place, and time.     Cranial Nerves: No cranial nerve deficit.  Sensory: No sensory deficit.     Motor: No weakness.     Coordination: Coordination normal.     Gait: Gait normal.     Deep Tendon Reflexes: Reflexes normal.  Psychiatric:        Mood and Affect: Mood normal.        Behavior: Behavior normal.        Thought Content: Thought content normal.        Judgment: Judgment normal.       Assessment & Plan:  1. Routine general medical  examination at a health care facility - Continue with lifestyle modifications  - I wish her the best in [place]! - CBC with Differential/Platelet; Future - Comprehensive metabolic panel; Future - Hemoglobin A1c; Future - Lipid panel; Future - TSH; Future - TSH - Lipid panel - Hemoglobin A1c - Comprehensive metabolic panel - CBC with Differential/Platelet  2. Common migraine with intractable migraine  - rizatriptan (MAXALT) 10 MG tablet; Take 1 tablet (10 mg total) by mouth as needed for migraine. May repeat in 2 hours if needed  Dispense: 10 tablet; Refill: 3  3. ERYTHROMELALGIA - Per rheumatology   4. Irritable bowel syndrome with constipation - Continue Miralax as needed  5. Hypothyroidism, unspecified type - Consider increase in synthroid  - CBC with Differential/Platelet; Future - Comprehensive metabolic panel; Future - Hemoglobin A1c; Future - Lipid panel; Future - TSH; Future - TSH - Lipid panel - Hemoglobin A1c - Comprehensive metabolic panel - CBC with Differential/Platelet  6. INTERSTITIAL CYSTITIS - Per urology and GYN  - CBC with Differential/Platelet; Future - Comprehensive metabolic panel; Future - Hemoglobin A1c; Future - Lipid panel; Future - TSH; Future - TSH - Lipid panel - Hemoglobin A1c - Comprehensive metabolic panel - CBC with Differential/Platelet  7. ANXIETY DISORDER, GENERALIZED - Continue with Zoloft  - CBC with Differential/Platelet; Future - Comprehensive metabolic panel; Future - Hemoglobin A1c; Future - Lipid panel; Future - TSH; Future - TSH - Lipid panel - Hemoglobin A1c - Comprehensive metabolic panel - CBC with Differential/Platelet  8. Flu vaccine need  - Flu Vaccine QUAD 6+ mos PF IM (Fluarix Quad PF)  Shirline Frees, NP

## 2021-12-01 NOTE — Patient Instructions (Signed)
It was great seeing you today   We will follow up with you regarding your lab work   Please let me know if you need anything   I wish you the best of luck in your new chapter!

## 2021-12-03 ENCOUNTER — Ambulatory Visit: Payer: Managed Care, Other (non HMO)

## 2021-12-09 ENCOUNTER — Encounter: Payer: Self-pay | Admitting: Adult Health

## 2021-12-31 ENCOUNTER — Encounter: Payer: Self-pay | Admitting: Family Medicine

## 2021-12-31 ENCOUNTER — Ambulatory Visit: Payer: Managed Care, Other (non HMO) | Admitting: Family Medicine

## 2021-12-31 VITALS — BP 108/72 | HR 84 | Temp 97.9°F | Ht 64.25 in | Wt 153.8 lb

## 2021-12-31 DIAGNOSIS — R238 Other skin changes: Secondary | ICD-10-CM

## 2021-12-31 MED ORDER — VALACYCLOVIR HCL 1 G PO TABS: 1000.0000 mg | ORAL_TABLET | Freq: Three times a day (TID) | ORAL | 0 refills | Status: AC

## 2021-12-31 NOTE — Progress Notes (Signed)
Established Patient Office Visit  Subjective   Patient ID: Erica Saunders, female    DOB: 1970/10/29  Age: 51 y.o. MRN: [id number]  Chief Complaint  Patient presents with   Rash    Patient complains of rash, x2 days     HPI   Erica Saunders is here with recurrent slightly painful rash left thoracic which makes the third occurrence in about 6 months.  She was seen in the ED both in March and July with a very similar rash in exact same location.  She was treated both times with Valtrex and actually steroids.   Current rash started couple days ago.  Pain is relatively mild.  No fever.  Does not take any immunosuppressants.  No history of recurrent bacterial infections.  She had HIV screening back in July which was negative.  She states this rash location is exactly the same spot as to prior episodes.  No other areas of rash.  Past Medical History:  Diagnosis Date   ALLERGIC RHINITIS CAUSE UNSPECIFIED 04/09/2008   ANA positive    Anxiety    ANXIETY DISORDER, GENERALIZED 10/10/2006   hospitalized  about age 62 after child  wih panic episode   Common migraine with intractable migraine 07/24/2019   DDD (degenerative disc disease), lumbar 01/26/2016   Erythromelalgia (HCC) 04/22/2009   Erythromelia (HCC)    Headache 01/26/2016   History of miscarriage    x 2    Hx of endometriosis    INTERSTITIAL CYSTITIS 10/10/2006   Irritable bowel syndrome 10/10/2006   Raynaud's disease 01/26/2016   Past Surgical History:  Procedure Laterality Date   ABDOMINAL HYSTERECTOMY     APPENDECTOMY     CESAREAN SECTION     CHOLECYSTECTOMY     DILATION AND CURETTAGE OF UTERUS     LAPAROSCOPY     endometriosis   TONSILLECTOMY AND ADENOIDECTOMY      reports that she has never smoked. She has never used smokeless tobacco. She reports current alcohol use of about 5.0 standard drinks of alcohol per week. She reports that she does not use drugs. family history includes Anxiety disorder in her mother; Colon cancer in  her paternal grandfather; Diabetes in her paternal grandfather; Hyperlipidemia in her mother; Lupus in her brother; Memory loss in her mother; Other in her father. Allergies  Allergen Reactions   Penicillins Anaphylaxis    REACTION: rash---red face   Celexa [Citalopram Hydrobromide]     Tic as side effect   Sulfa Antibiotics Nausea Only   Trazodone And Nefazodone Other (See Comments)    Excess sedation   Ceftin [Cefuroxime] Nausea Only   Prozac [Fluoxetine Hcl] Hives    Can take paxil     Review of Systems  Constitutional:  Negative for chills, fever and weight loss.  Cardiovascular:  Negative for chest pain.  Skin:  Positive for rash.      Objective:     BP 108/72 (BP Location: Left Arm, Patient Position: Sitting, Cuff Size: Normal)   Pulse 84   Temp 97.9 F (36.6 C) (Oral)   Ht 5' 4.25" (1.632 m)   Wt 153 lb 12.8 oz (69.8 kg)   SpO2 97%   BMI 26.19 kg/m  Wt Readings from Last 3 Encounters:  12/31/21 153 lb 12.8 oz (69.8 kg)  12/01/21 152 lb 3.2 oz (69 kg)  10/21/21 153 lb (69.4 kg)      Physical Exam Vitals reviewed.  Constitutional:      Appearance: Normal appearance.  Cardiovascular:     Rate and Rhythm: Normal rate and regular rhythm.  Musculoskeletal:     Right lower leg: No edema.  Skin:    Comments: Patient has area of rash left thoracic region approximately 2 x 5 cm with erythematous papular base and near the center cluster of vesicles.  This is oriented along what appears to be a dermatome.   No other areas of rash noted  Neurological:     Mental Status: She is alert.      No results found for any visits on 12/31/21.    The 10-year ASCVD risk score (Arnett DK, et al., 2019) is: 0.5%    Assessment & Plan:   Recurrent vesicular rash left thoracic area.  This makes the third occurrence in 6 months.  This does clinically look like probable shingles.  Doubt herpetic but will obtain viral culture  -Go ahead and start Valtrex 1 g 3 times daily  for 7 days -Consider further evaluation of immune function, especially for further recurrences.  She did have recent HIV screen which is negative as well as normal CBC with differential. No follow-ups on file.    Carolann Littler, MD

## 2022-01-06 LAB — VIRUS CULTURE

## 2022-03-13 ENCOUNTER — Encounter: Payer: Self-pay | Admitting: Adult Health

## 2022-03-15 ENCOUNTER — Other Ambulatory Visit: Payer: Self-pay | Admitting: Adult Health

## 2022-03-15 MED ORDER — ALPRAZOLAM 0.5 MG PO TABS
ORAL_TABLET | ORAL | 1 refills | Status: AC
Start: 2022-03-15 — End: ?

## 2022-03-15 NOTE — Telephone Encounter (Signed)
Ok to fill Rx for pt and spouse?

## 2022-04-21 ENCOUNTER — Encounter: Payer: Self-pay | Admitting: Gastroenterology

## 2022-09-06 IMAGING — CT CT ANGIO CHEST
2 of 8 series · 19 of 46 positions shown · IV contrast (OMNIPAQUE 350)
Comparison: None.

CLINICAL DATA: Shortness of breath.  Chest pressure.

EXAM:
CT ANGIOGRAPHY CHEST WITH CONTRAST
TECHNIQUE: Multidetector CT imaging of the chest was performed using the
standard protocol during bolus administration of intravenous
contrast. Multiplanar CT image reconstructions and MIPs were
obtained to evaluate the vascular anatomy.
CONTRAST:  80mL OMNIPAQUE IOHEXOL 350 MG/ML SOLN

[Series 5: thins · axial · 0.71mm/px · z∈[-308,-58]mm · 16 of 282 slices shown]
[im 16/282  lung]
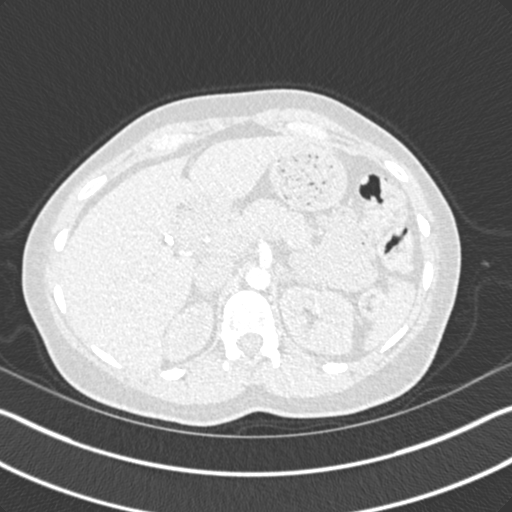
[im 32/282  soft-tissue]
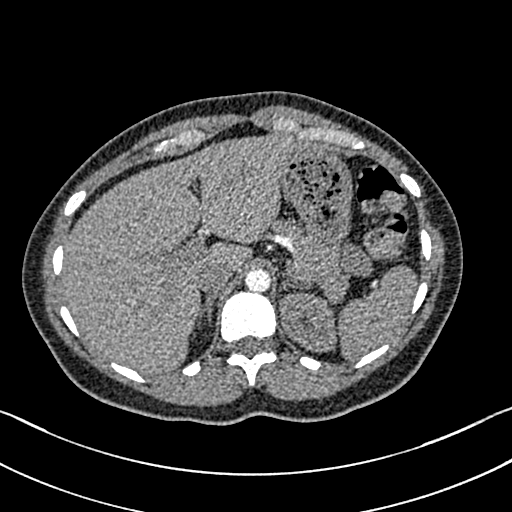
[im 47/282  lung]
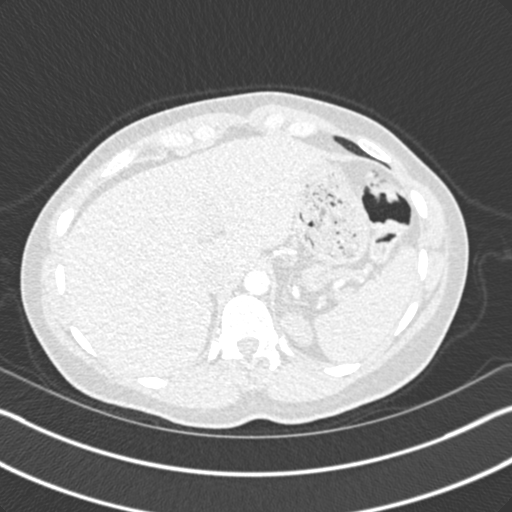
[im 63/282  soft-tissue]
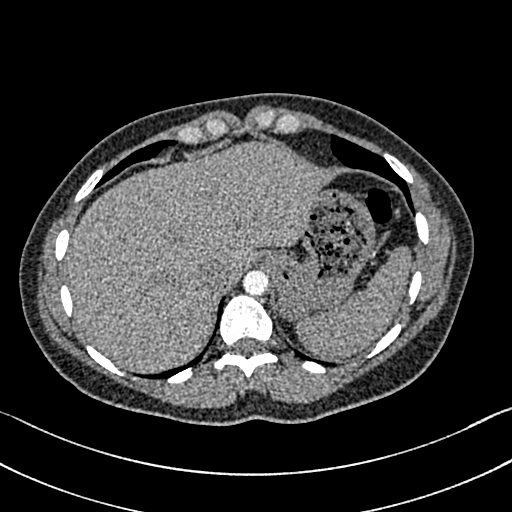
[im 79/282  lung]
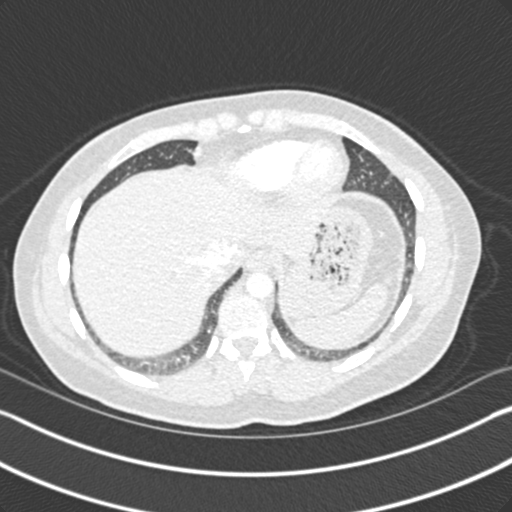
[im 94/282  soft-tissue]
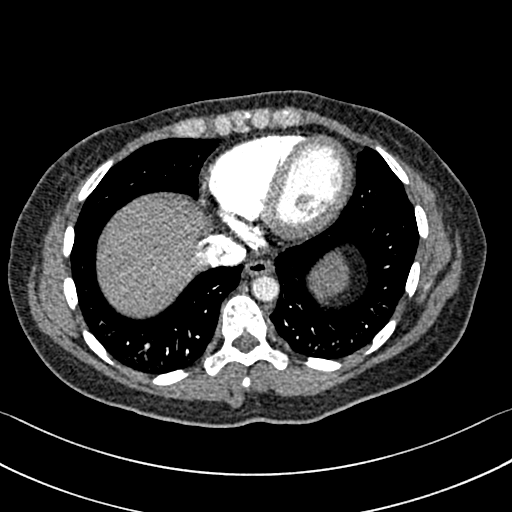
[im 110/282  lung]
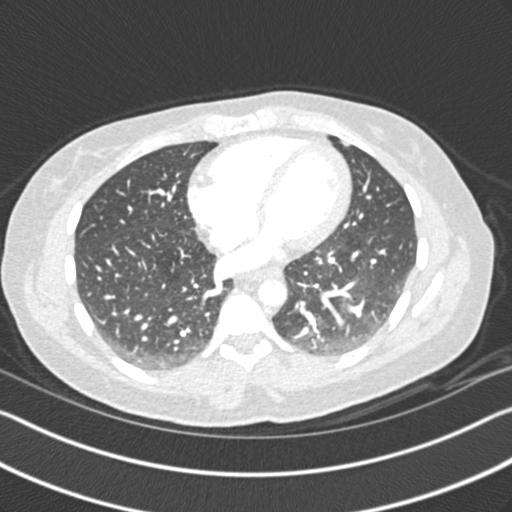
[im 125/282  soft-tissue]
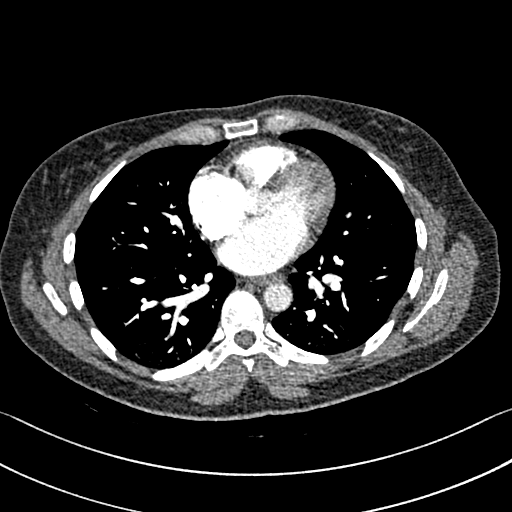
[im 157/282  lung]
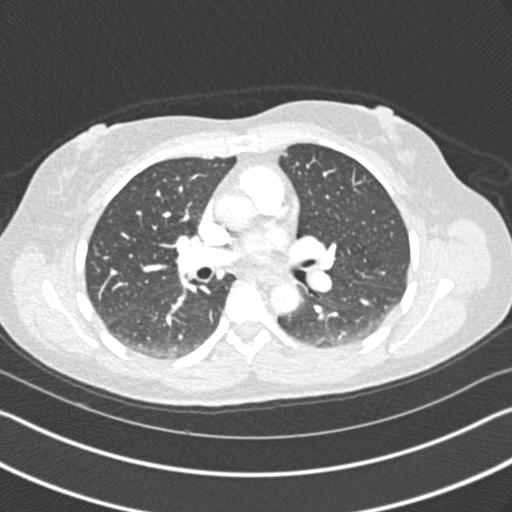
[im 172/282  soft-tissue]
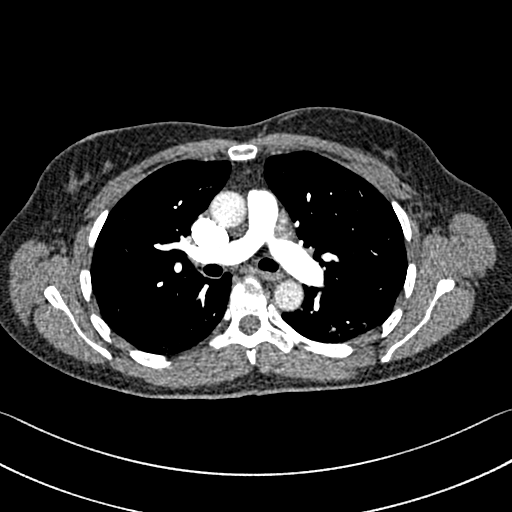
[im 188/282  lung]
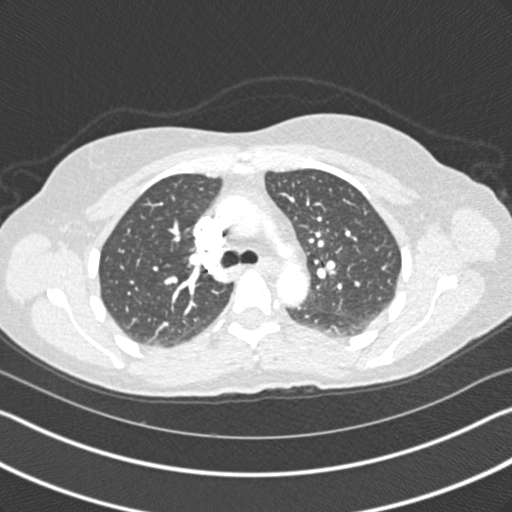
[im 203/282  soft-tissue]
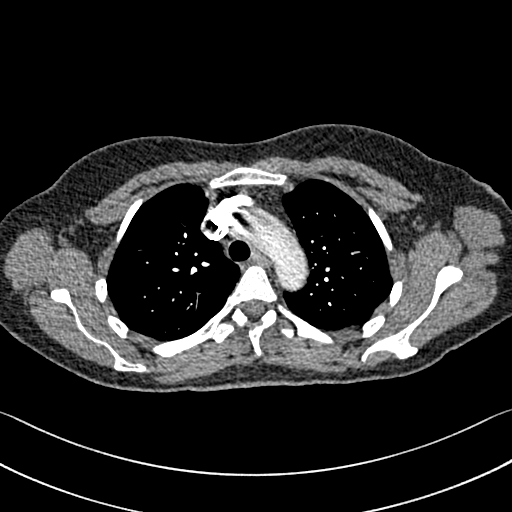
[im 219/282  lung]
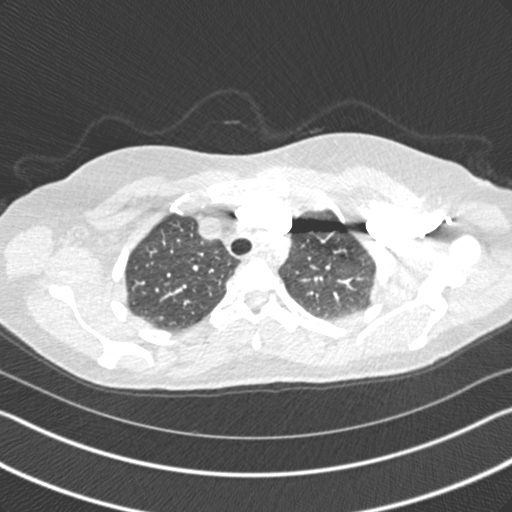
[im 235/282  soft-tissue]
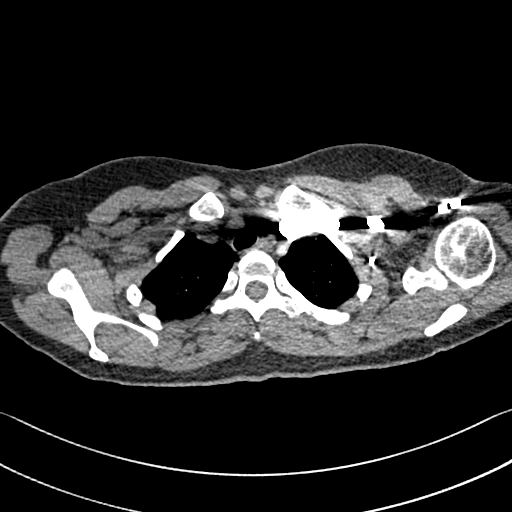
[im 250/282  lung]
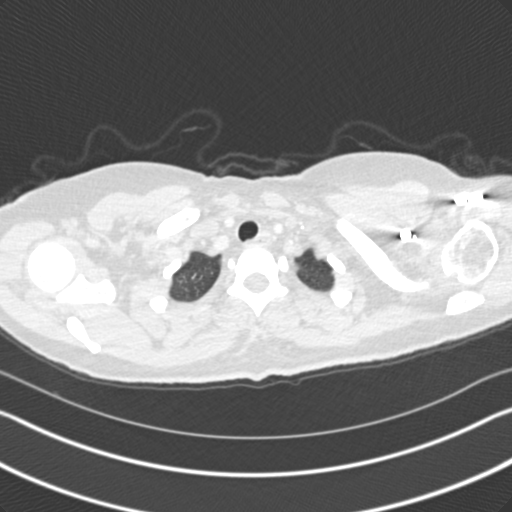
[im 266/282  soft-tissue]
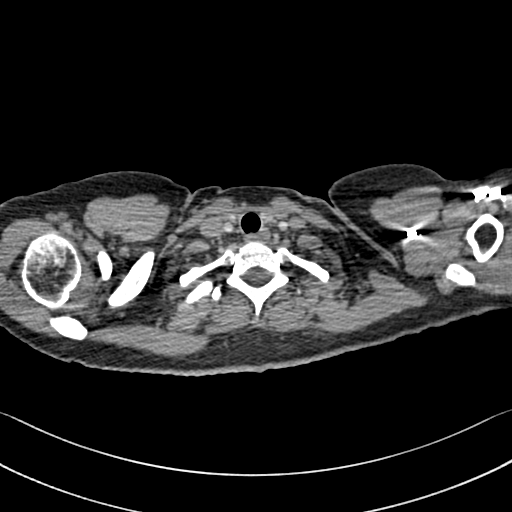

[Series 7: coronal mpr · coronal · 0.59mm/px · 3 of 139 slices shown]
[im 35/139  soft-tissue]
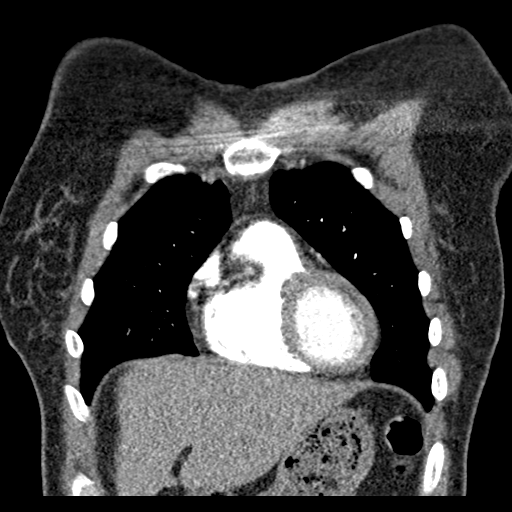
[im 70/139  soft-tissue]
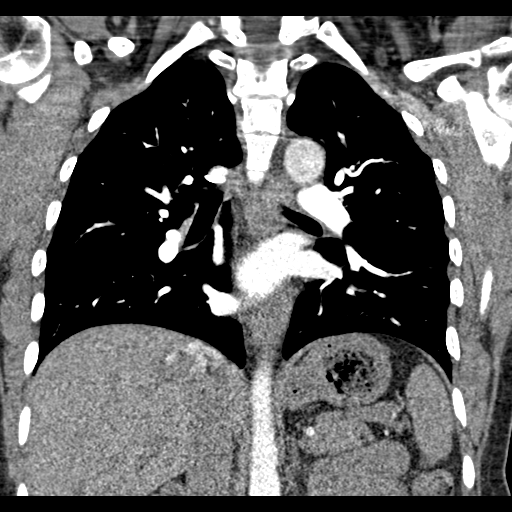
[im 104/139  soft-tissue]
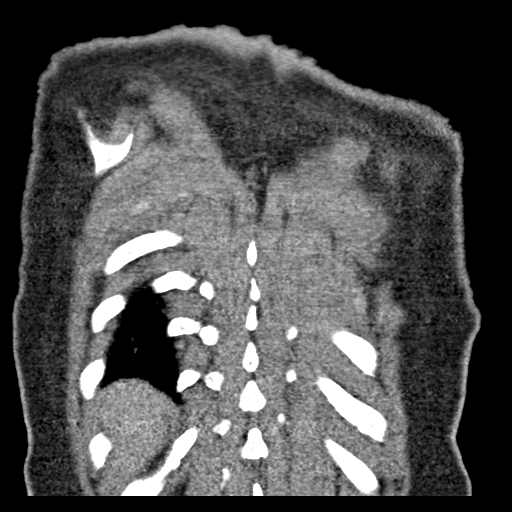

[19 of 46 positions shown; findings below may reference images not displayed]

FINDINGS: Cardiovascular: Satisfactory opacification of the pulmonary arteries
to the segmental level. No evidence of pulmonary embolism. Normal
heart size. No pericardial effusion.

Mediastinum/Nodes: No enlarged mediastinal, hilar, or axillary lymph
nodes. Thyroid gland, trachea, and esophagus demonstrate no
significant findings.

Lungs/Pleura: Lungs are clear. No pleural effusion or pneumothorax.

Upper Abdomen: No acute abnormality.

Musculoskeletal: No chest wall abnormality. No acute or significant
osseous findings.

Review of the MIP images confirms the above findings.
IMPRESSION: No definite evidence of pulmonary embolus. No acute cardiopulmonary
abnormality seen.

## 2023-04-18 ENCOUNTER — Telehealth: Payer: Self-pay | Admitting: Adult Health

## 2023-04-18 NOTE — Telephone Encounter (Signed)
Lmom for pt to sch cpe or ov
